# Patient Record
Sex: Female | Born: 1990
Health system: Southern US, Community
[De-identification: ages and names within clinical notes are randomized; demographics above are authoritative.]

## PROBLEM LIST (undated history)

## (undated) ENCOUNTER — Emergency Department: Admission: EM | Payer: Self-pay | Source: Home / Self Care

## (undated) DIAGNOSIS — B9689 Other specified bacterial agents as the cause of diseases classified elsewhere: Secondary | ICD-10-CM

## (undated) DIAGNOSIS — F32A Depression, unspecified: Secondary | ICD-10-CM

## (undated) DIAGNOSIS — A749 Chlamydial infection, unspecified: Secondary | ICD-10-CM

## (undated) DIAGNOSIS — S7290XA Unspecified fracture of unspecified femur, initial encounter for closed fracture: Secondary | ICD-10-CM

## (undated) DIAGNOSIS — N760 Acute vaginitis: Secondary | ICD-10-CM

## (undated) DIAGNOSIS — E559 Vitamin D deficiency, unspecified: Secondary | ICD-10-CM

## (undated) DIAGNOSIS — R7689 Other specified abnormal immunological findings in serum: Secondary | ICD-10-CM

## (undated) DIAGNOSIS — F419 Anxiety disorder, unspecified: Secondary | ICD-10-CM

## (undated) DIAGNOSIS — R768 Other specified abnormal immunological findings in serum: Secondary | ICD-10-CM

## (undated) HISTORY — DX: Depression, unspecified: F32.A

## (undated) HISTORY — DX: Vitamin D deficiency, unspecified: E55.9

## (undated) HISTORY — DX: Anxiety disorder, unspecified: F41.9

## (undated) HISTORY — DX: Unspecified fracture of unspecified femur, initial encounter for closed fracture: S72.90XA

## (undated) HISTORY — DX: Chlamydial infection, unspecified: A74.9

## (undated) HISTORY — PX: KNEE SURGERY: SHX244

## (undated) HISTORY — DX: Acute vaginitis: N76.0

## (undated) HISTORY — DX: Other specified bacterial agents as the cause of diseases classified elsewhere: B96.89

## (undated) HISTORY — DX: Other specified abnormal immunological findings in serum: R76.89

## (undated) HISTORY — DX: Other specified abnormal immunological findings in serum: R76.8

---

## 2010-12-11 ENCOUNTER — Emergency Department: Payer: Self-pay | Admitting: Emergency Medicine

## 2011-11-29 ENCOUNTER — Emergency Department: Payer: Self-pay | Admitting: Emergency Medicine

## 2011-11-29 LAB — COMPREHENSIVE METABOLIC PANEL
Alkaline Phosphatase: 109 U/L (ref 50–136)
Anion Gap: 8 (ref 7–16)
Bilirubin,Total: 0.3 mg/dL (ref 0.2–1.0)
Calcium, Total: 9.1 mg/dL (ref 8.5–10.1)
Co2: 29 mmol/L (ref 21–32)
EGFR (Non-African Amer.): >60
Glucose: 94 mg/dL (ref 65–99)
Osmolality: 278 (ref 275–301)
Sodium: 140 mmol/L (ref 136–145)
Total Protein: 8.2 g/dL (ref 6.4–8.2)

## 2011-11-29 LAB — CBC
HCT: 38.7 % (ref 35.0–47.0)
MCV: 83 fL (ref 80–100)
WBC: 13.3 10*3/uL — ABNORMAL HIGH (ref 3.6–11.0)

## 2011-11-29 LAB — LIPASE, BLOOD: Lipase: 187 U/L (ref 73–393)

## 2012-01-28 HISTORY — PX: KNEE SURGERY: SHX244

## 2012-09-18 ENCOUNTER — Emergency Department: Payer: Self-pay | Admitting: Emergency Medicine

## 2012-09-18 LAB — BASIC METABOLIC PANEL
Anion Gap: 5 — ABNORMAL LOW (ref 7–16)
Calcium, Total: 8.6 mg/dL (ref 8.5–10.1)
Co2: 27 mmol/L (ref 21–32)
EGFR (Non-African Amer.): >60
Glucose: 114 mg/dL — ABNORMAL HIGH (ref 65–99)
Potassium: 3.6 mmol/L (ref 3.5–5.1)

## 2012-09-18 LAB — CBC
HCT: 35.4 % (ref 35.0–47.0)
MCH: 27.8 pg (ref 26.0–34.0)
MCV: 82 fL (ref 80–100)
Platelet: 300 10*3/uL (ref 150–440)
RBC: 4.32 10*6/uL (ref 3.80–5.20)
RDW: 13.9 % (ref 11.5–14.5)
WBC: 14.7 10*3/uL — ABNORMAL HIGH (ref 3.6–11.0)

## 2014-05-29 ENCOUNTER — Other Ambulatory Visit: Payer: Self-pay | Admitting: Otolaryngology

## 2014-05-29 DIAGNOSIS — E049 Nontoxic goiter, unspecified: Secondary | ICD-10-CM

## 2014-05-31 ENCOUNTER — Ambulatory Visit: Payer: Self-pay

## 2016-06-18 ENCOUNTER — Ambulatory Visit (INDEPENDENT_AMBULATORY_CARE_PROVIDER_SITE_OTHER): Payer: Commercial Managed Care - PPO | Admitting: Obstetrics and Gynecology

## 2016-06-18 ENCOUNTER — Encounter: Payer: Self-pay | Admitting: Obstetrics and Gynecology

## 2016-06-18 VITALS — BP 130/80 | HR 75 | Ht 73.0 in | Wt 297.0 lb

## 2016-06-18 DIAGNOSIS — N914 Secondary oligomenorrhea: Secondary | ICD-10-CM | POA: Diagnosis not present

## 2016-06-18 DIAGNOSIS — R635 Abnormal weight gain: Secondary | ICD-10-CM

## 2016-06-18 NOTE — Progress Notes (Signed)
   Chief Complaint  Patient presents with  . Late Period    HPI:      Ms. Luciana AxeJessica A Eisenberger is a 26 y.o. G0P0000 who LMP was Patient's last menstrual period was 04/05/2016., presents today for irregular menses. She was seen 2/18 for her annual and at that time had had a light period 1/18 with neg UPTs. Menses used to be monthly, lasting 5 days, medium flow. She stopped OCPs 6/17 due to indigestion and wanting to conceive. She has gained 22# since 4/17. She tried herbalife to lose wt but didn't stick with it. She had a regular period 3/18 that lasted 4 days. No menses/spotting since. Pt had neg UPT at home a few days ago.  She denies pelvic pain, urin sx, vag sx.  There are no active problems to display for this patient.   Family History  Problem Relation Age of Onset  . Hypertension Mother   . Diabetes Maternal Grandmother   . Breast cancer Paternal Grandmother   . Diabetes Maternal Aunt   . Diabetes Maternal Uncle     Social History   Social History  . Marital status: Single    Spouse name: N/A  . Number of children: N/A  . Years of education: N/A   Occupational History  . Not on file.   Social History Main Topics  . Smoking status: Current Every Day Smoker  . Smokeless tobacco: Never Used  . Alcohol use No  . Drug use: No  . Sexual activity: Yes    Birth control/ protection: None   Other Topics Concern  . Not on file   Social History Narrative  . No narrative on file    No current outpatient prescriptions on file.  Review of Systems  Constitutional: Negative for fever.  Gastrointestinal: Negative for blood in stool, constipation, diarrhea, nausea and vomiting.  Genitourinary: Positive for menstrual problem. Negative for dyspareunia, dysuria, flank pain, frequency, hematuria, urgency, vaginal bleeding, vaginal discharge and vaginal pain.  Musculoskeletal: Negative for back pain.  Skin: Negative for rash.     OBJECTIVE:   Vitals:  BP 130/80   Pulse 75   Ht  6\' 1"  (1.854 m)   Wt 297 lb (134.7 kg)   LMP 04/05/2016   BMI 39.18 kg/m   Physical Exam  DEFERRED SINCE DONE 2/18   Assessment/Plan: Secondary oligomenorrhea - Check labs. Most likely related to wt gain. Will f/u with results. If no menses by 6/18, will do provera Rx.  - Plan: Hemoglobin A1c, TSH + free T4, Testosterone,Free and Total, Prolactin, FSH/LH, Estradiol, DHEA-sulfate, Androstenedione, 17-Hydroxyprogesterone  Morbid obesity (HCC) - Plan: TSH + free T4  Weight gain - Diet/exercise/wt loss/MyFitness pal, wt watchers discussed.  - Plan: TSH + free T4     Return if symptoms worsen or fail to improve.  Lesslie Mckeehan B. Kaya Pottenger, PA-C 06/18/2016 11:05 AM

## 2016-06-22 LAB — TSH+FREE T4
FREE T4: 0.99 ng/dL (ref 0.82–1.77)
TSH: 0.953 u[IU]/mL (ref 0.450–4.500)

## 2016-06-22 LAB — PROLACTIN: Prolactin: 12.4 ng/mL (ref 4.8–23.3)

## 2016-06-22 LAB — FSH/LH
FSH: 1 m[IU]/mL
LH: 1.6 m[IU]/mL

## 2016-06-22 LAB — ESTRADIOL: Estradiol: 76.7 pg/mL

## 2016-06-22 LAB — 17-HYDROXYPROGESTERONE: 17 HYDROXYPROGESTERONE: 117 ng/dL

## 2016-06-22 LAB — DHEA-SULFATE: DHEA-SO4: 378.6 ug/dL — ABNORMAL HIGH (ref 84.8–378.0)

## 2016-06-22 LAB — ANDROSTENEDIONE: ANDROSTENEDIONE: 48 ng/dL (ref 41–262)

## 2016-06-22 LAB — HEMOGLOBIN A1C
ESTIMATED AVERAGE GLUCOSE: 105 mg/dL
Hgb A1c MFr Bld: 5.3 % (ref 4.8–5.6)

## 2016-06-22 LAB — TESTOSTERONE,FREE AND TOTAL
Testosterone, Free: 3.9 pg/mL (ref 0.0–4.2)
Testosterone: 30 ng/dL (ref 8–48)

## 2016-06-26 ENCOUNTER — Telehealth: Payer: Self-pay | Admitting: Obstetrics and Gynecology

## 2016-06-26 NOTE — Telephone Encounter (Signed)
Pt aware of neg labs. Oligomenorrhea most likely related to wt gain. Recommended wt loss again to see if menses resume to normal for conception. If no change, can f/u with MD for fertility. Pt states menses started last wk. F/u prn.

## 2016-08-12 ENCOUNTER — Encounter: Payer: Self-pay | Admitting: *Deleted

## 2016-08-12 ENCOUNTER — Ambulatory Visit
Admission: EM | Admit: 2016-08-12 | Discharge: 2016-08-12 | Disposition: A | Discharge status: Home / Self Care | Payer: Commercial Managed Care - PPO | Attending: Family Medicine | Admitting: Family Medicine

## 2016-08-12 DIAGNOSIS — S29019A Strain of muscle and tendon of unspecified wall of thorax, initial encounter: Secondary | ICD-10-CM | POA: Diagnosis not present

## 2016-08-12 DIAGNOSIS — M549 Dorsalgia, unspecified: Secondary | ICD-10-CM

## 2016-08-12 MED ORDER — MELOXICAM 15 MG PO TABS: 15.0000 mg | ORAL_TABLET | Freq: Every day | ORAL | 0 refills | Status: DC | PRN

## 2016-08-12 MED ORDER — CYCLOBENZAPRINE HCL 10 MG PO TABS: 10.0000 mg | ORAL_TABLET | Freq: Two times a day (BID) | ORAL | 0 refills | Status: DC | PRN

## 2016-08-12 NOTE — Discharge Instructions (Signed)
Take medication as prescribed. Rest. Drink plenty of fluids. Stretch.  ° °Follow up with your primary care physician this week as needed. Return to Urgent care for new or worsening concerns.  ° °

## 2016-08-12 NOTE — ED Provider Notes (Signed)
[facility] ____________________________________________  Time seen: Approximately 9:51 AM  I have reviewed the triage vital signs and the nursing notes.   HISTORY  Chief Complaint Back Pain   HPI Jillian AxeJessica A Garcia is a 26 y.o. female presenting for evaluation of back pain. Patient reports she has had back pain since this past Thursday. Patient reports this past Thursday she was at work at her [employer] job. States that she was standing upright and went to move a box of lids that weighs approximately 20 pounds to the side. Reports as she twisted holding a box she felt onset of back pain. Patient reports she continued to work. Reports Friday the pain was better with Aleve. 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 Patient then reports Sunday and Monday pain had increased in the same area. Denies any fall, direct injury or trauma. Denies atypical heavy lifting. Denies pain radiation, paresthesias, skin changes, insect bites, fevers, chest pain, shortness of breath. Reports continues to eat and drink well. Reports she has had history of some similar back pain in the past that went away. Reports is continue to remain active. States pain improves with rest, worsens with activity. Has tried over-the-counter Aleve and ointments, with some improvement, no resolution. Denies other aggravating or alleviating factors. Reports otherwise feels well.   Denies chest pain, shortness of breath, abdominal pain, dysuria, extremity pain, extremity swelling or rash. Denies recent sickness. Denies recent antibiotic use. Denies cardiac history. Denies renal insufficiency.  Patient's last menstrual period was 07/23/2016 (approximate).: Denies pregnancy   History reviewed. No pertinent past medical history. Denies  There are no active problems to display for this patient.   Past Surgical History:  Procedure Laterality Date    . KNEE SURGERY       No current facility-administered medications for this encounter.   Current Outpatient Prescriptions:  .  cyclobenzaprine (FLEXERIL) 10 MG tablet, Take 1 tablet (10 mg total) by mouth 2 (two) times daily as needed for muscle spasms. Do not drive while taking as can cause drowsiness, Disp: 15 tablet, Rfl: 0 .  meloxicam (MOBIC) 15 MG tablet, Take 1 tablet (15 mg total) by mouth daily as needed., Disp: 10 tablet, Rfl: 0  Allergies Patient has no known allergies.  Family History  Problem Relation Age of Onset  . Hypertension Mother   . Diabetes Maternal Grandmother   . Breast cancer Paternal Grandmother   . Diabetes Maternal Aunt   . Diabetes Maternal Uncle     Social History Social History  Substance Use Topics  . Smoking status: Current Every Day Smoker  . Smokeless tobacco: Never Used  . Alcohol use No    Review of Systems Constitutional: No fever/chills Cardiovascular: Denies chest pain. Respiratory: Denies shortness of breath. Gastrointestinal: No abdominal pain.  No nausea, no vomiting.  No diarrhea.   Genitourinary: Negative for dysuria. Musculoskeletal: Positive for back pain. Skin: Negative for rash.   ____________________________________________   PHYSICAL EXAM:  VITAL SIGNS: ED Triage Vitals  Enc Vitals Group     BP 08/12/16 0902 134/88     Pulse Rate 08/12/16 0902 74     Resp 08/12/16 0902 16     Temp 08/12/16 0902 98 F (36.7 C)     Temp Source 08/12/16 0902 Oral     SpO2 08/12/16 0902 100 %     Weight 08/12/16 0904 285 lb (129.3 kg)  Height 08/12/16 0904 6\' 1"  (1.854 m)     Head Circumference --      Peak Flow --      Pain Score 08/12/16 0905 10     Pain Loc --      Pain Edu? --      Excl. in GC? --     Constitutional: Alert and oriented. Well appearing and in no acute distress. Cardiovascular: Normal rate, regular rhythm. Grossly normal heart sounds.  Good peripheral circulation. Respiratory: Normal respiratory  effort without tachypnea nor retractions. Breath sounds are clear and equal bilaterally. No wheezes, rales, rhonchi. Gastrointestinal: Soft and nontender. No CVA tenderness. Musculoskeletal:  No midline cervical, thoracic or lumbar tenderness to palpation. Mild mid thoracic parathoracic tenderness to palpitation along bilateral latissimus dorsi, no midline tenderness, pain increases mildly with lumbar flexion and extension and right and left lumbar rotation, pain increases moderately with bilateral overhear stretching, full range of motion present. 5/5 strength to bilateral upper and lower extremities. No pain with standing bilateral knee lifts, steady gait. Changes positions quickly in room. Bilateral plantar flexion and dorsiflexion strong and equal. No saddle anesthesia.  Neurologic:  Normal speech and language. No gross focal neurologic deficits are appreciated. Speech is normal. No gait instability.  Skin:  Skin is warm, dry and intact. No rash noted. Psychiatric: Mood and affect are normal. Speech and behavior are normal. Patient exhibits appropriate insight and judgment   ___________________________________________   LABS (all labs ordered are listed, but only abnormal results are displayed)  Labs Reviewed - No data to display ____________________________________________  PROCEDURES Procedures    INITIAL IMPRESSION / ASSESSMENT AND PLAN / ED COURSE  Pertinent labs & imaging results that were available during my care of the patient were reviewed by me and considered in my medical decision making (see chart for details).  Well-appearing patient. No acute distress. Presents for evaluation of thoracic back pain. No midline tenderness. No direct trauma. Suspect strain injury. Encouraged rest, ice versus heat, stretching and avoidance of strenuous activity. Also discussed with patient strict follow-up and return parameters and follow-up for continued pain with concern of disc injury. Will  treat patient with oral daily Modicon when necessary Flexeril. Work note given for today and tomorrow.Discussed indication, risks and benefits of medications with patient.  Discussed follow up with Primary care physician this week. Discussed follow up and return parameters including no resolution or any worsening concerns. Patient verbalized understanding and agreed to plan.   ____________________________________________   FINAL CLINICAL IMPRESSION(S) / ED DIAGNOSES  Final diagnoses:  Thoracic myofascial strain, initial encounter     Discharge Medication List as of 08/12/2016 10:09 AM    START taking these medications   Details  cyclobenzaprine (FLEXERIL) 10 MG tablet Take 1 tablet (10 mg total) by mouth 2 (two) times daily as needed for muscle spasms. Do not drive while taking as can cause drowsiness, Starting Tue 08/12/2016, Normal    meloxicam (MOBIC) 15 MG tablet Take 1 tablet (15 mg total) by mouth daily as needed., Starting Tue 08/12/2016, Normal        Note: This dictation was prepared with Dragon dictation along with smaller phrase technology. Any transcriptional errors that result from this process are unintentional.         Renford Dills, NP 08/12/16 1141

## 2016-08-12 NOTE — ED Triage Notes (Signed)
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 Pain has persisted and worse this am.

## 2016-10-27 DIAGNOSIS — A749 Chlamydial infection, unspecified: Secondary | ICD-10-CM

## 2016-10-27 HISTORY — DX: Chlamydial infection, unspecified: A74.9

## 2016-11-18 ENCOUNTER — Ambulatory Visit: Payer: Commercial Managed Care - PPO | Admitting: Obstetrics and Gynecology

## 2016-11-19 ENCOUNTER — Ambulatory Visit (INDEPENDENT_AMBULATORY_CARE_PROVIDER_SITE_OTHER): Payer: Commercial Managed Care - PPO | Admitting: Obstetrics and Gynecology

## 2016-11-19 ENCOUNTER — Encounter: Payer: Self-pay | Admitting: Obstetrics and Gynecology

## 2016-11-19 VITALS — BP 132/82 | HR 80 | Ht 73.0 in | Wt 276.0 lb

## 2016-11-19 DIAGNOSIS — Z113 Encounter for screening for infections with a predominantly sexual mode of transmission: Secondary | ICD-10-CM

## 2016-11-19 DIAGNOSIS — N760 Acute vaginitis: Secondary | ICD-10-CM | POA: Diagnosis not present

## 2016-11-19 DIAGNOSIS — B9689 Other specified bacterial agents as the cause of diseases classified elsewhere: Secondary | ICD-10-CM | POA: Diagnosis not present

## 2016-11-19 LAB — POCT WET PREP WITH KOH
Clue Cells Wet Prep HPF POC: POSITIVE
KOH Prep POC: POSITIVE — AB
Trichomonas, UA: NEGATIVE
Yeast Wet Prep HPF POC: NEGATIVE

## 2016-11-19 MED ORDER — CLINDAMYCIN PHOSPHATE 2 % VA CREA: 1.0000 | TOPICAL_CREAM | Freq: Every day | VAGINAL | 0 refills | Status: DC

## 2016-11-19 NOTE — Progress Notes (Signed)
Chief Complaint  Patient presents with  . Vaginal Discharge    BV/YI? Itching/Odor x 1 wk. Used Metrogel rx'd by Phone visit Insurance    HPI:      [name] is a 26 y.o. G0P0000 who LMP was Patient's last menstrual period was 10/28/2016., presents today for BV sx of increased d/c, odor, irritation for the past wk. No LBP, belly pain, fevers. No urin sx. She was treated 9/18 with flagyl by MD online but she didn't use "it correctly" and sx recurred.  She has a hx of recurrent BV in the past. Culture showed bacteria not responsive to flagyl in 2017.   She is sex active, with new partner. She is not using condoms.   Past Medical History:  Diagnosis Date  . BV (bacterial vaginosis)     Past Surgical History:  Procedure Laterality Date  . KNEE SURGERY      Family History  Problem Relation Age of Onset  . Hypertension Mother   . Diabetes Maternal Grandmother   . Breast cancer Paternal Grandmother   . Diabetes Maternal Aunt   . Diabetes Maternal Uncle     Social History   Social History  . Marital status: Single    Spouse name: N/A  . Number of children: N/A  . Years of education: N/A   Occupational History  . Not on file.   Social History Main Topics  . Smoking status: Current Every Day Smoker  . Smokeless tobacco: Never Used  . Alcohol use No  . Drug use: No  . Sexual activity: Yes    Birth control/ protection: None   Other Topics Concern  . Not on file   Social History Narrative  . No narrative on file     Current Outpatient Prescriptions:  .  clindamycin (CLEOCIN) 2 % vaginal cream, Place 1 Applicatorful vaginally at bedtime., Disp: 40 g, Rfl: 0 .  cyclobenzaprine (FLEXERIL) 10 MG tablet, Take 1 tablet (10 mg total) by mouth 2 (two) times daily as needed for muscle spasms. Do not drive while taking as can cause drowsiness (Patient not taking: Reported on 11/19/2016), Disp: 15 tablet, Rfl: 0 .  meloxicam (MOBIC) 15 MG tablet, Take 1 tablet (15 mg  total) by mouth daily as needed. (Patient not taking: Reported on 11/19/2016), Disp: 10 tablet, Rfl: 0   ROS:  Review of Systems  Constitutional: Negative for fever.  Gastrointestinal: Negative for blood in stool, constipation, diarrhea, nausea and vomiting.  Genitourinary: Positive for vaginal discharge. Negative for dyspareunia, dysuria, flank pain, frequency, hematuria, urgency, vaginal bleeding and vaginal pain.  Musculoskeletal: Negative for back pain.  Skin: Negative for rash.     OBJECTIVE:   Vitals:  BP 132/82 (BP Location: Left Arm, Patient Position: Sitting, Cuff Size: Large)   Pulse 80   Ht 6\' 1"  (1.854 m)   Wt 276 lb (125.2 kg)   LMP 10/28/2016   BMI 36.41 kg/m   Physical Exam  Constitutional: She is oriented to person, place, and time and well-developed, well-nourished, and in no distress. Vital signs are normal.  Genitourinary: Uterus normal, cervix normal, right adnexa normal and left adnexa normal. Uterus is not enlarged. Cervix exhibits no motion tenderness and no tenderness. Right adnexum displays no mass and no tenderness. Left adnexum displays no mass and no tenderness. Vulva exhibits exudate. Vulva exhibits no erythema, no lesion, no rash and no tenderness. Vagina exhibits no lesion. Thin  fishy  white and vaginal discharge found.  Neurological: She is oriented to person, place, and time.  Vitals reviewed.   Results: Results for orders placed or performed in visit on 11/19/16 (from the past 24 hour(s))  POCT Wet Prep with KOH     Status: Abnormal   Collection Time: 11/19/16  5:10 PM  Result Value Ref Range   Trichomonas, UA Negative    Clue Cells Wet Prep HPF POC pos    Epithelial Wet Prep HPF POC  Few, Moderate, Many, Too numerous to count   Yeast Wet Prep HPF POC neg    Bacteria Wet Prep HPF POC  Few   RBC Wet Prep HPF POC     WBC Wet Prep HPF POC     KOH Prep POC Positive (A) Negative     Assessment/Plan: Bacterial vaginosis - Pos wet prep. Hx  of recurrent in past. Rx cleocin. Add probiotics/condoms. F/u prn.  - Plan: clindamycin (CLEOCIN) 2 % vaginal cream, POCT Wet Prep with KOH  Screening for STD (sexually transmitted disease) - Plan: Chlamydia/Gonococcus/Trichomonas, NAA    Meds ordered this encounter  Medications  . clindamycin (CLEOCIN) 2 % vaginal cream    Sig: Place 1 Applicatorful vaginally at bedtime.    Dispense:  40 g    Refill:  0      Return if symptoms worsen or fail to improve.  Alicia B. Copland, PA-C 11/19/2016 5:11 PM

## 2016-11-24 ENCOUNTER — Telehealth: Payer: Self-pay | Admitting: Obstetrics and Gynecology

## 2016-11-24 ENCOUNTER — Encounter: Payer: Self-pay | Admitting: Obstetrics and Gynecology

## 2016-11-24 DIAGNOSIS — A749 Chlamydial infection, unspecified: Secondary | ICD-10-CM | POA: Insufficient documentation

## 2016-11-24 LAB — CHLAMYDIA/GONOCOCCUS/TRICHOMONAS, NAA
Chlamydia by NAA: POSITIVE — AB
Gonococcus by NAA: NEGATIVE
Trich vag by NAA: NEGATIVE

## 2016-11-24 MED ORDER — DOXYCYCLINE HYCLATE 100 MG PO CAPS: 100.0000 mg | ORAL_CAPSULE | Freq: Two times a day (BID) | ORAL | 0 refills | Status: DC

## 2016-11-24 NOTE — Telephone Encounter (Signed)
LM with abn culture results. Pt needs to call back for further details (didn't want to leave on VM). Rx doxy eRxd. Partner needs tx. RN to notify [facility]. RTO in 3 wks for TOC.

## 2016-12-15 ENCOUNTER — Ambulatory Visit: Payer: Commercial Managed Care - PPO | Admitting: Obstetrics and Gynecology

## 2016-12-31 ENCOUNTER — Encounter: Payer: Self-pay | Admitting: Obstetrics and Gynecology

## 2016-12-31 ENCOUNTER — Ambulatory Visit (INDEPENDENT_AMBULATORY_CARE_PROVIDER_SITE_OTHER): Payer: Commercial Managed Care - PPO | Admitting: Obstetrics and Gynecology

## 2016-12-31 VITALS — BP 128/82 | HR 80 | Ht 73.0 in | Wt 288.0 lb

## 2016-12-31 DIAGNOSIS — A749 Chlamydial infection, unspecified: Secondary | ICD-10-CM

## 2016-12-31 DIAGNOSIS — N760 Acute vaginitis: Secondary | ICD-10-CM | POA: Diagnosis not present

## 2016-12-31 DIAGNOSIS — Z113 Encounter for screening for infections with a predominantly sexual mode of transmission: Secondary | ICD-10-CM

## 2016-12-31 DIAGNOSIS — B9689 Other specified bacterial agents as the cause of diseases classified elsewhere: Secondary | ICD-10-CM | POA: Diagnosis not present

## 2016-12-31 LAB — POCT WET PREP WITH KOH
CLUE CELLS WET PREP PER HPF POC: POSITIVE
KOH Prep POC: POSITIVE — AB
Trichomonas, UA: NEGATIVE
Yeast Wet Prep HPF POC: NEGATIVE

## 2016-12-31 MED ORDER — CLINDAMYCIN HCL 300 MG PO CAPS: 300.0000 mg | ORAL_CAPSULE | Freq: Two times a day (BID) | ORAL | 0 refills | Status: AC

## 2016-12-31 NOTE — Progress Notes (Signed)
Chief Complaint  Patient presents with  . Follow-up    TOC     HPI:      [name] is a 26 y.o. G0P0000 who LMP was No LMP recorded., presents today for STD test of cure. She was diagnosed with chlamydia 10/18.She was treated with doxy BID for 7 days. Her partner has been treated. She denies any further symptoms.   She has noticed vaginal itching and fishy odor without increased d/c for the past 2 wks. She treated with diflucan for vaginal itching 12/03/16 after doxy use. Sx resolved temporarily but have since recurred. Pt with hx of recurrent BV. Culture showed bacteria not responsive to flagyl 2017. Pt last treated for BV 10/18 with cleocin vag crm. Pt currently on her period.  Past Medical History:  Diagnosis Date  . BV (bacterial vaginosis)   . Chlamydia 10/2016    Social History   Socioeconomic History  . Marital status: Single    Spouse name: Not on file  . Number of children: Not on file  . Years of education: Not on file  . Highest education level: Not on file  Social Needs  . Financial resource strain: Not on file  . Food insecurity - worry: Not on file  . Food insecurity - inability: Not on file  . Transportation needs - medical: Not on file  . Transportation needs - non-medical: Not on file  Occupational History  . Not on file  Tobacco Use  . Smoking status: Current Every Day Smoker  . Smokeless tobacco: Never Used  Substance and Sexual Activity  . Alcohol use: No  . Drug use: No  . Sexual activity: Yes    Birth control/protection: None  Other Topics Concern  . Not on file  Social History Narrative  . Not on file     Current Outpatient Medications:  .  clindamycin (CLEOCIN) 2 % vaginal cream, Place 1 Applicatorful vaginally at bedtime. (Patient not taking: Reported on 12/31/2016), Disp: 40 g, Rfl: 0 .  clindamycin (CLEOCIN) 300 MG capsule, Take 1 capsule (300 mg total) by mouth 2 (two) times daily for 7 days., Disp: 14 capsule, Rfl: 0 .   cyclobenzaprine (FLEXERIL) 10 MG tablet, Take 1 tablet (10 mg total) by mouth 2 (two) times daily as needed for muscle spasms. Do not drive while taking as can cause drowsiness (Patient not taking: Reported on 11/19/2016), Disp: 15 tablet, Rfl: 0 .  doxycycline (VIBRAMYCIN) 100 MG capsule, Take 1 capsule (100 mg total) by mouth 2 (two) times daily. (Patient not taking: Reported on 12/31/2016), Disp: 14 capsule, Rfl: 0 .  meloxicam (MOBIC) 15 MG tablet, Take 1 tablet (15 mg total) by mouth daily as needed. (Patient not taking: Reported on 11/19/2016), Disp: 10 tablet, Rfl: 0   ROS:  Review of Systems  Constitutional: Negative for fever.  Gastrointestinal: Negative for blood in stool, constipation, diarrhea, nausea and vomiting.  Genitourinary: Negative for dyspareunia, dysuria, flank pain, frequency, hematuria, urgency, vaginal bleeding, vaginal discharge and vaginal pain.  Musculoskeletal: Negative for back pain.  Skin: Negative for rash.     Objective: BP 128/82 (BP Location: Left Arm, Patient Position: Sitting, Cuff Size: Normal)   Pulse 80   Ht 6\' 1"  (1.854 m)   Wt 288 lb (130.6 kg)   BMI 38.00 kg/m    Physical Exam  Genitourinary: There is no rash, tenderness, lesion or Bartholin's cyst on the right labia. There is no rash, tenderness, lesion or Bartholin's cyst  on the left labia. There is bleeding in the vagina. No erythema or tenderness in the vagina. No vaginal discharge found. Right adnexum does not display mass and does not display tenderness. Left adnexum does not display mass and does not display tenderness. Cervix does not exhibit motion tenderness, discharge or friability. Uterus is not enlarged or tender.  Nursing note and vitals reviewed.  Results for orders placed or performed in visit on 12/31/16 (from the past 24 hour(s))  POCT Wet Prep with KOH     Status: Abnormal   Collection Time: 12/31/16  5:02 PM  Result Value Ref Range   Trichomonas, UA Negative    Clue Cells  Wet Prep HPF POC pos    Epithelial Wet Prep HPF POC  Few, Moderate, Many, Too numerous to count   Yeast Wet Prep HPF POC neg    Bacteria Wet Prep HPF POC  Few   RBC Wet Prep HPF POC     WBC Wet Prep HPF POC     KOH Prep POC Positive (A) Negative     Assessment/Plan: Chlamydia - TOC today. Nuswab. Will call pt with resutls. - Plan: Chlamydia/Gonococcus/Trichomonas, NAA  Screening for STD (sexually transmitted disease) - Plan: Chlamydia/Gonococcus/Trichomonas, NAA  Bacterial vaginosis - Rx clindamycin since on period. CONDOMS/probiotics. May need maintenance tx if sx recur. - Plan: POCT Wet Prep with KOH, clindamycin (CLEOCIN) 300 MG capsule   F/U  Return if symptoms worsen or fail to improve.  Mayford Alberg B. Azaliyah Kennard, PA-C 12/31/2016 5:02 PM

## 2016-12-31 NOTE — Patient Instructions (Signed)
I value your feedback and entrusting us with your care. If you get a [facility] patient survey, I would appreciate you taking the time to let us know about your experience today. Thank you! 

## 2017-01-06 LAB — CHLAMYDIA/GONOCOCCUS/TRICHOMONAS, NAA
Chlamydia by NAA: NEGATIVE
Gonococcus by NAA: NEGATIVE
Trich vag by NAA: NEGATIVE

## 2017-01-22 ENCOUNTER — Other Ambulatory Visit: Payer: Self-pay | Admitting: Obstetrics and Gynecology

## 2017-01-22 DIAGNOSIS — B9689 Other specified bacterial agents as the cause of diseases classified elsewhere: Secondary | ICD-10-CM

## 2017-01-22 DIAGNOSIS — N760 Acute vaginitis: Principal | ICD-10-CM

## 2017-01-22 MED ORDER — CLINDAMYCIN PHOSPHATE 2 % VA CREA: 1.0000 | TOPICAL_CREAM | Freq: Every day | VAGINAL | 0 refills | Status: DC

## 2017-01-22 NOTE — Telephone Encounter (Signed)
Please advise for refill?  

## 2017-01-22 NOTE — Progress Notes (Signed)
Recurrent BV

## 2017-02-16 ENCOUNTER — Ambulatory Visit (INDEPENDENT_AMBULATORY_CARE_PROVIDER_SITE_OTHER): Payer: Commercial Managed Care - PPO | Admitting: Obstetrics and Gynecology

## 2017-02-16 ENCOUNTER — Encounter: Payer: Self-pay | Admitting: Obstetrics and Gynecology

## 2017-02-16 VITALS — BP 128/80 | HR 67 | Ht 73.0 in | Wt 286.0 lb

## 2017-02-16 DIAGNOSIS — N3001 Acute cystitis with hematuria: Secondary | ICD-10-CM | POA: Diagnosis not present

## 2017-02-16 LAB — POCT URINALYSIS DIPSTICK
BILIRUBIN UA: NEGATIVE
GLUCOSE UA: NEGATIVE
KETONES UA: NEGATIVE
Nitrite, UA: NEGATIVE
Protein, UA: NEGATIVE
SPEC GRAV UA: 1.02 (ref 1.010–1.025)
pH, UA: 6 (ref 5.0–8.0)

## 2017-02-16 MED ORDER — CIPROFLOXACIN HCL 500 MG PO TABS: 500.0000 mg | ORAL_TABLET | Freq: Two times a day (BID) | ORAL | 0 refills | Status: AC

## 2017-02-16 NOTE — Patient Instructions (Signed)
I value your feedback and entrusting us with your care. If you get a [facility] patient survey, I would appreciate you taking the time to let us know about your experience today. Thank you! 

## 2017-02-16 NOTE — Progress Notes (Signed)
Chief Complaint  Patient presents with  . Urinary Tract Infection    HPI:      [name] is a 27 y.o. G0P0000 who LMP was Patient's last menstrual period was 01/30/2017., presents today for UTI. She has had dysuria, frequency, nocturia, and urine odor for the past month. She was prescribed macrobid by [facility] a few wks ago and sx improved while pt was on abx, but sx recurred the day after she completed tx. No vag sx. No LBP, belly pain, fevers. No current vaginal bleeding.    Past Medical History:  Diagnosis Date  . BV (bacterial vaginosis)   . Chlamydia 10/2016    Past Surgical History:  Procedure Laterality Date  . KNEE SURGERY      Family History  Problem Relation Age of Onset  . Hypertension Mother   . Diabetes Maternal Grandmother   . Breast cancer Paternal Grandmother   . Diabetes Maternal Aunt   . Diabetes Maternal Uncle     Social History   Socioeconomic History  . Marital status: Single    Spouse name: Not on file  . Number of children: Not on file  . Years of education: Not on file  . Highest education level: Not on file  Social Needs  . Financial resource strain: Not on file  . Food insecurity - worry: Not on file  . Food insecurity - inability: Not on file  . Transportation needs - medical: Not on file  . Transportation needs - non-medical: Not on file  Occupational History  . Not on file  Tobacco Use  . Smoking status: Former Games developermoker  . Smokeless tobacco: Never Used  Substance and Sexual Activity  . Alcohol use: No  . Drug use: No  . Sexual activity: Yes    Birth control/protection: None  Other Topics Concern  . Not on file  Social History Narrative  . Not on file     Current Outpatient Medications:  .  ciprofloxacin (CIPRO) 500 MG tablet, Take 1 tablet (500 mg total) by mouth 2 (two) times daily for 5 days., Disp: 10 tablet, Rfl: 0 .  clindamycin (CLEOCIN) 2 % vaginal cream, Place 1 Applicatorful vaginally at bedtime. (Patient  not taking: Reported on 02/16/2017), Disp: 40 g, Rfl: 0 .  cyclobenzaprine (FLEXERIL) 10 MG tablet, Take 1 tablet (10 mg total) by mouth 2 (two) times daily as needed for muscle spasms. Do not drive while taking as can cause drowsiness (Patient not taking: Reported on 11/19/2016), Disp: 15 tablet, Rfl: 0 .  doxycycline (VIBRAMYCIN) 100 MG capsule, Take 1 capsule (100 mg total) by mouth 2 (two) times daily. (Patient not taking: Reported on 12/31/2016), Disp: 14 capsule, Rfl: 0 .  meloxicam (MOBIC) 15 MG tablet, Take 1 tablet (15 mg total) by mouth daily as needed. (Patient not taking: Reported on 11/19/2016), Disp: 10 tablet, Rfl: 0 .  nitrofurantoin, macrocrystal-monohydrate, (MACROBID) 100 MG capsule, Take 100 mg by mouth 2 (two) times daily., Disp: , Rfl: 0   ROS:  Review of Systems  Constitutional: Negative for fever.  Gastrointestinal: Negative for blood in stool, constipation, diarrhea, nausea and vomiting.  Genitourinary: Positive for dysuria, frequency and urgency. Negative for dyspareunia, flank pain, hematuria, vaginal bleeding, vaginal discharge and vaginal pain.  Musculoskeletal: Negative for back pain.  Skin: Negative for rash.     OBJECTIVE:   Vitals:  BP 128/80   Pulse 67   Ht 6\' 1"  (1.854 m)   Wt 286 lb (129.7  kg)   LMP 01/30/2017   BMI 37.73 kg/m   Physical Exam  Constitutional: She is oriented to person, place, and time and well-developed, well-nourished, and in no distress.  Abdominal: There is no CVA tenderness.  Neurological: She is alert and oriented to person, place, and time.  Psychiatric: Affect and judgment normal.  Vitals reviewed.   Results: Results for orders placed or performed in visit on 02/16/17 (from the past 24 hour(s))  POCT Urinalysis Dipstick     Status: Abnormal   Collection Time: 02/16/17 11:21 AM  Result Value Ref Range   Color, UA yellow    Clarity, UA cloudy    Glucose, UA neg    Bilirubin, UA neg    Ketones, UA neg    Spec Grav,  UA 1.020 1.010 - 1.025   Blood, UA trace    pH, UA 6.0 5.0 - 8.0   Protein, UA neg    Urobilinogen, UA  0.2 or 1.0 E.U./dL   Nitrite, UA neg    Leukocytes, UA Moderate (2+) (A) Negative   Appearance     Odor       Assessment/Plan: Acute cystitis with hematuria - Pos dip. Check C&S. Rx cipro. Will f/u with results and sx. - Plan: POCT Urinalysis Dipstick, Urine Culture, ciprofloxacin (CIPRO) 500 MG tablet    Meds ordered this encounter  Medications  . ciprofloxacin (CIPRO) 500 MG tablet    Sig: Take 1 tablet (500 mg total) by mouth 2 (two) times daily for 5 days.    Dispense:  10 tablet    Refill:  0      Return if symptoms worsen or fail to improve.  Alicia B. Copland, PA-C 02/16/2017 11:22 AM

## 2017-02-16 NOTE — Progress Notes (Signed)
286lb

## 2017-02-18 LAB — URINE CULTURE

## 2017-03-17 ENCOUNTER — Encounter: Payer: Self-pay | Admitting: Advanced Practice Midwife

## 2017-03-17 ENCOUNTER — Ambulatory Visit (INDEPENDENT_AMBULATORY_CARE_PROVIDER_SITE_OTHER): Payer: Commercial Managed Care - PPO | Admitting: Advanced Practice Midwife

## 2017-03-17 VITALS — BP 130/98 | HR 66 | Ht 73.0 in | Wt 289.0 lb

## 2017-03-17 DIAGNOSIS — Z113 Encounter for screening for infections with a predominantly sexual mode of transmission: Secondary | ICD-10-CM | POA: Diagnosis not present

## 2017-03-17 NOTE — Progress Notes (Signed)
S: The patient is here today with concern for STDs. Her boyfriend has recently had some genital swelling and was diagnosed with HSV. She has had the same partner since last August. She does not use protection against STDs. She does not use any method of birth control. "If it happens, it happens". She has also had some discharge, odor and itching in the past week. She has a history of BV, yeast and UTI in the recent past. She uses AZO for symptoms with good relief. She also wonders about a possible pregnancy since she had some spotting after her period. Reassurance given that it is unlikely she is pregnant since she is probably ovulating around now and the last time she had intercourse was 1 week ago.   O: Vital Signs: BP (!) 130/98   Pulse 66   Ht 6\' 1"  (1.854 m)   Wt 289 lb (131.1 kg)   LMP 03/02/2017 (Exact Date)   BMI 38.13 kg/m  Constitutional: Well nourished, well developed female in no acute distress.  HEENT: normal Skin: Warm and dry.  Cardiovascular: Regular rate and rhythm.   Respiratory: Clear to auscultation bilateral. Normal respiratory effort Psych: Alert and Oriented x3. No memory deficits. Normal mood and affect.  MS: normal gait, normal bilateral lower extremity ROM/strength/stability.  Pelvic exam:  is not limited by body habitus EGBUS: within normal limits Vagina: within normal limits and with normal mucosa, thick white discharge present Cervix: appearance normal  Wet Prep negative for clue cells, yeast and whiff  A: 27 yo female STD testing, vaginitis symptoms  P: NuSwab for vaginitis plus STDs STD panel Return to clinic PRN  Tresea MallJane Izsak Meir, CNM

## 2017-03-18 LAB — RPR QUALITATIVE: RPR Ser Ql: NONREACTIVE

## 2017-03-18 LAB — HIV ANTIBODY (ROUTINE TESTING W REFLEX): HIV Screen 4th Generation wRfx: NONREACTIVE

## 2017-03-18 LAB — HSV 1 AND 2 IGM ABS, INDIRECT: HSV 1 IgM: <1:10 {titer}

## 2017-03-18 LAB — HEPATITIS B SURFACE ANTIBODY,QUALITATIVE: Hep B Surface Ab, Qual: REACTIVE

## 2017-03-18 LAB — HEPATITIS C ANTIBODY

## 2017-03-18 LAB — HSV 2 ANTIBODY, IGG

## 2017-03-20 ENCOUNTER — Telehealth: Payer: Self-pay | Admitting: Advanced Practice Midwife

## 2017-03-20 NOTE — Telephone Encounter (Signed)
Pt is calling back about her lab results. Please advise if there is a prescriptions she  Need.

## 2017-03-20 NOTE — Telephone Encounter (Signed)
Spoke with patient and reviewed results with her.

## 2017-03-22 LAB — NUSWAB VAGINITIS PLUS (VG+)
CANDIDA ALBICANS, NAA: POSITIVE — AB
CANDIDA GLABRATA, NAA: NEGATIVE
Chlamydia trachomatis, NAA: NEGATIVE
NEISSERIA GONORRHOEAE, NAA: NEGATIVE
Trich vag by NAA: NEGATIVE

## 2017-04-03 ENCOUNTER — Other Ambulatory Visit: Payer: Self-pay | Admitting: Advanced Practice Midwife

## 2017-04-03 DIAGNOSIS — B379 Candidiasis, unspecified: Secondary | ICD-10-CM

## 2017-04-03 MED ORDER — FLUCONAZOLE 150 MG PO TABS: 150.0000 mg | ORAL_TABLET | Freq: Once | ORAL | 1 refills | Status: DC

## 2017-04-03 NOTE — Progress Notes (Signed)
Rx diflucan sent to patient pharmacy/ Patient aware.

## 2017-07-24 ENCOUNTER — Ambulatory Visit (INDEPENDENT_AMBULATORY_CARE_PROVIDER_SITE_OTHER): Payer: Commercial Managed Care - PPO | Admitting: Obstetrics and Gynecology

## 2017-07-24 ENCOUNTER — Encounter: Payer: Self-pay | Admitting: Obstetrics and Gynecology

## 2017-07-24 VITALS — BP 128/82 | HR 92 | Ht 73.0 in | Wt 293.0 lb

## 2017-07-24 DIAGNOSIS — R309 Painful micturition, unspecified: Secondary | ICD-10-CM | POA: Diagnosis not present

## 2017-07-24 DIAGNOSIS — Z113 Encounter for screening for infections with a predominantly sexual mode of transmission: Secondary | ICD-10-CM

## 2017-07-24 DIAGNOSIS — N926 Irregular menstruation, unspecified: Secondary | ICD-10-CM | POA: Diagnosis not present

## 2017-07-24 LAB — POCT URINALYSIS DIPSTICK
BILIRUBIN UA: NEGATIVE
Blood, UA: NEGATIVE
Glucose, UA: NEGATIVE
KETONES UA: NEGATIVE
Leukocytes, UA: NEGATIVE
Nitrite, UA: NEGATIVE
PROTEIN UA: NEGATIVE
Spec Grav, UA: 1.025 (ref 1.010–1.025)
Urobilinogen, UA: 0.2 E.U./dL
pH, UA: 5 (ref 5.0–8.0)

## 2017-07-24 LAB — POCT URINE PREGNANCY: Preg Test, Ur: NEGATIVE

## 2017-07-24 NOTE — Progress Notes (Signed)
   Patient ID: Jillian Garcia, female   DOB: 1990/03/03, 27 y.o.   MRN: [id number]  Reason for Consult: Urinary Tract Infection (Possible Yeast infection, itching, some discharge, slight odor,Sharp pain with urination on 07/22/17, lower back pain x 1 week )   Referred by No ref. provider found  Subjective:     HPI:  Jillian Garcia is a 27 y.o. female she presents today with complaints of vaginal itching. She has been having pain with urination and lower back pain as well.    Past Medical History:  Diagnosis Date  . BV (bacterial vaginosis)   . Chlamydia 10/2016   Family History  Problem Relation Age of Onset  . Hypertension Mother   . Diabetes Maternal Grandmother   . Breast cancer Paternal Grandmother   . Diabetes Maternal Aunt   . Diabetes Maternal Uncle    Past Surgical History:  Procedure Laterality Date  . KNEE SURGERY      Short Social History:  Social History   Tobacco Use  . Smoking status: Former Games developermoker  . Smokeless tobacco: Never Used  Substance Use Topics  . Alcohol use: No    No Known Allergies  Current Outpatient Medications  Medication Sig Dispense Refill  . Prenatal Vit-Fe Fumarate-FA (PRENATAL MULTIVITAMIN) TABS tablet Take 1 tablet by mouth daily at 12 noon.    . Probiotic Product (PROBIOTIC-10) CAPS Take by mouth.    . nitrofurantoin, macrocrystal-monohydrate, (MACROBID) 100 MG capsule Take 100 mg by mouth 2 (two) times daily.  0   No current facility-administered medications for this visit.     Review of Systems  Constitutional: Negative for chills, fatigue, fever and unexpected weight change.  HENT: Negative for trouble swallowing.  Eyes: Negative for loss of vision.  Respiratory: Negative for cough, shortness of breath and wheezing.  Cardiovascular: Negative for chest pain, leg swelling, palpitations and syncope.  GI: Negative for abdominal pain, blood in stool, diarrhea, nausea and vomiting.  GU: Negative for difficulty urinating, dysuria,  frequency and hematuria.  Musculoskeletal: Negative for back pain, leg pain and joint pain.  Skin: Negative for rash.  Neurological: Negative for dizziness, headaches, light-headedness, numbness and seizures.  Psychiatric: Negative for behavioral problem, confusion, depressed mood and sleep disturbance.        Objective:  Objective   Vitals:   07/24/17 1145  BP: 128/82  Pulse: 92  Weight: 293 lb (132.9 kg)  Height: 6\' 1"  (1.854 m)   Body mass index is 38.66 kg/m.  Physical Exam      Assessment/Plan:     27 yo with vaginitis versus UTI Will check urine culture and nuswab and treat based on results   Adelene Idlerhristanna Lareen Mullings MD, Merlinda FrederickFACOG [facility], [facility] 05/18/2020 9:28 AM

## 2017-07-26 LAB — URINE CULTURE

## 2017-08-03 ENCOUNTER — Other Ambulatory Visit: Payer: Self-pay | Admitting: Obstetrics and Gynecology

## 2017-08-03 DIAGNOSIS — B3731 Acute candidiasis of vulva and vagina: Secondary | ICD-10-CM

## 2017-08-03 DIAGNOSIS — B373 Candidiasis of vulva and vagina: Secondary | ICD-10-CM

## 2017-08-03 LAB — NUSWAB VAGINITIS PLUS (VG+)
Candida albicans, NAA: POSITIVE — AB
Candida glabrata, NAA: NEGATIVE
Chlamydia trachomatis, NAA: NEGATIVE
NEISSERIA GONORRHOEAE, NAA: NEGATIVE
Trich vag by NAA: NEGATIVE

## 2017-08-03 MED ORDER — FLUCONAZOLE 150 MG PO TABS: 150.0000 mg | ORAL_TABLET | ORAL | 0 refills | Status: DC

## 2017-08-03 NOTE — Progress Notes (Signed)
Yeast, prescription sent to pharmacy, note sent to mychart.

## 2017-08-04 ENCOUNTER — Encounter: Payer: Self-pay | Admitting: Obstetrics and Gynecology

## 2017-11-03 ENCOUNTER — Other Ambulatory Visit: Payer: Self-pay

## 2017-11-03 ENCOUNTER — Ambulatory Visit
Admission: EM | Admit: 2017-11-03 | Discharge: 2017-11-03 | Disposition: A | Discharge status: Home / Self Care | Payer: Worker's Compensation | Attending: Family Medicine | Admitting: Family Medicine

## 2017-11-03 ENCOUNTER — Encounter: Payer: Self-pay | Admitting: Emergency Medicine

## 2017-11-03 DIAGNOSIS — S29019A Strain of muscle and tendon of unspecified wall of thorax, initial encounter: Secondary | ICD-10-CM | POA: Diagnosis not present

## 2017-11-03 DIAGNOSIS — X58XXXA Exposure to other specified factors, initial encounter: Secondary | ICD-10-CM | POA: Diagnosis not present

## 2017-11-03 MED ORDER — NAPROXEN 500 MG PO TABS: 500.0000 mg | ORAL_TABLET | Freq: Two times a day (BID) | ORAL | 0 refills | Status: DC

## 2017-11-03 MED ORDER — METAXALONE 800 MG PO TABS: 800.0000 mg | ORAL_TABLET | Freq: Three times a day (TID) | ORAL | 0 refills | Status: DC

## 2017-11-03 NOTE — ED Triage Notes (Signed)
Patient c/o muscle strain in her upper back yesterday while at work. Patient describes the pain as a sharp pain. Patient stated she was lifting a stack of prints when she injured her back.

## 2017-11-03 NOTE — Discharge Instructions (Signed)
Avoid symptoms as much as possible.  Try to alter the ergonomics of your job to avoid stressing the area between your shoulder blades.Apply ice 20 minutes out of every 2 hours 4-5 times daily for comfort. Use  Caution while taking muscle relaxers.  Do not perform activities requiring concentration or judgment and do not drive.

## 2017-11-03 NOTE — ED Provider Notes (Signed)
[facility]    CSN: [id number] Arrival date & time: 11/03/17  1528     History   Chief Complaint Chief Complaint  Patient presents with  . Muscle Pain    W/C DOI 11/02/2017    HPI Jillian Garcia is a 27 y.o. female.   HPI  27 year old female presents with left sided intrascapular back pain that occurred yesterday while at work.  She describes the pain as sharp.  He was lifting a stack of prints required by her job when she injured her back.  Had a very similar episode in the past which is well-documented on 08/12/2016.  She is dominant left-handed and her injury is on the left side.  She states that she does this repetitively throughout the day.  No radicular component.           Past Medical History:  Diagnosis Date  . BV (bacterial vaginosis)   . Chlamydia 10/2016    Patient Active Problem List   Diagnosis Date Noted  . Chlamydia 11/24/2016    Past Surgical History:  Procedure Laterality Date  . KNEE SURGERY      OB History    Gravida  0   Para  0   Term  0   Preterm  0   AB  0   Living  0     SAB  0   TAB  0   Ectopic  0   Multiple  0   Live Births  0            Home Medications    Prior to Admission medications   Medication Sig Start Date End Date Taking? Authorizing Provider  metaxalone (SKELAXIN) 800 MG tablet Take 1 tablet (800 mg total) by mouth 3 (three) times daily. 11/03/17   Lutricia Feil, PA-C  naproxen (NAPROSYN) 500 MG tablet Take 1 tablet (500 mg total) by mouth 2 (two) times daily with a meal. 11/03/17   Lutricia Feil, PA-C    Family History Family History  Problem Relation Age of Onset  . Hypertension Mother   . Diabetes Maternal Grandmother   . Breast cancer Paternal Grandmother   . Diabetes Maternal Aunt   . Diabetes Maternal Uncle     Social History Social History   Tobacco Use  . Smoking status: Former Games developer  . Smokeless tobacco: Never Used  Substance Use Topics  . Alcohol  use: Not Currently  . Drug use: No     Allergies   Patient has no known allergies.   Review of Systems Review of Systems  Constitutional: Positive for activity change. Negative for appetite change, chills, fatigue and fever.  Musculoskeletal: Positive for back pain and myalgias.  All other systems reviewed and are negative.    Physical Exam Triage Vital Signs ED Triage Vitals  Enc Vitals Group     BP 11/03/17 1544 133/68     Pulse Rate 11/03/17 1544 84     Resp 11/03/17 1544 18     Temp 11/03/17 1544 98.7 F (37.1 C)     Temp Source 11/03/17 1544 Oral     SpO2 11/03/17 1544 100 %     Weight 11/03/17 1543 290 lb (131.5 kg)     Height 11/03/17 1543 6\' 1"  (1.854 m)     Head Circumference --      Peak Flow --      Pain Score 11/03/17 1542 7     Pain Loc --  Pain Edu? --      Excl. in GC? --    No data found.  Updated Vital Signs BP 133/68 (BP Location: Right Arm)   Pulse 84   Temp 98.7 F (37.1 C) (Oral)   Resp 18   Ht 6\' 1"  (1.854 m)   Wt 290 lb (131.5 kg)   LMP 10/26/2017   SpO2 100%   BMI 38.26 kg/m   Visual Acuity Right Eye Distance:   Left Eye Distance:   Bilateral Distance:    Right Eye Near:   Left Eye Near:    Bilateral Near:     Physical Exam  Constitutional: She is oriented to person, place, and time. She appears well-developed and well-nourished. No distress.  HENT:  Head: Normocephalic.  Eyes: Pupils are equal, round, and reactive to light.  Neck: Normal range of motion.  Pulmonary/Chest: Effort normal and breath sounds normal. She exhibits tenderness.  Musculoskeletal: Normal range of motion. She exhibits tenderness.  Examination thoracic spine shows tenderness in the muscles adjacent to the left sternal border near the scapular tip.  Reproduces her symptoms.  Drastic rotation to the left also reproduces symptoms.  She is not as tender towards the right.  To forward flex without discomfort.  Upper extremity sensation and strength are  intact throughout  Neurological: She is alert and oriented to person, place, and time.  Skin: Skin is warm and dry. She is not diaphoretic.  Psychiatric: She has a normal mood and affect. Her behavior is normal. Judgment and thought content normal.  Nursing note and vitals reviewed.    UC Treatments / Results  Labs (all labs ordered are listed, but only abnormal results are displayed) Labs Reviewed - No data to display  EKG None  Radiology No results found.  Procedures Procedures (including critical care time)  Medications Ordered in UC Medications - No data to display  Initial Impression / Assessment and Plan / UC Course  I have reviewed the triage vital signs and the nursing notes.  Pertinent labs & imaging results that were available during my care of the patient were reviewed by me and considered in my medical decision making (see chart for details).     Reviewed rest and symptom avoidance.  Recommend ice for the next 2 to 3 days and then may alternate between ice and heat.  Place her on Naprosyn and Skelaxin with appropriate precautions.  Follow-up would be at [facility] regional occupational health.  Given her restrictions for 7 days. Final Clinical Impressions(s) / UC Diagnoses   Final diagnoses:  Thoracic myofascial strain, initial encounter     Discharge Instructions     Avoid symptoms as much as possible.  Try to alter the ergonomics of your job to avoid stressing the area between your shoulder blades.Apply ice 20 minutes out of every 2 hours 4-5 times daily for comfort. Use  Caution while taking muscle relaxers.  Do not perform activities requiring concentration or judgment and do not drive.     ED Prescriptions    Medication Sig Dispense Auth. Provider   naproxen (NAPROSYN) 500 MG tablet Take 1 tablet (500 mg total) by mouth 2 (two) times daily with a meal. 60 tablet Lutricia Feil, PA-C   metaxalone (SKELAXIN) 800 MG tablet Take 1 tablet (800 mg total)  by mouth 3 (three) times daily. 21 tablet Lutricia Feil, PA-C     Controlled Substance Prescriptions [place] Controlled Substance Registry consulted? Not Applicable   Lutricia Feil, PA-C  11/03/17 1741  

## 2017-12-13 ENCOUNTER — Encounter: Payer: Self-pay | Admitting: Emergency Medicine

## 2017-12-13 DIAGNOSIS — R109 Unspecified abdominal pain: Secondary | ICD-10-CM | POA: Insufficient documentation

## 2017-12-13 DIAGNOSIS — R300 Dysuria: Secondary | ICD-10-CM | POA: Insufficient documentation

## 2017-12-13 DIAGNOSIS — Z5321 Procedure and treatment not carried out due to patient leaving prior to being seen by health care provider: Secondary | ICD-10-CM | POA: Insufficient documentation

## 2017-12-13 LAB — URINALYSIS, COMPLETE (UACMP) WITH MICROSCOPIC
Bilirubin Urine: NEGATIVE
Glucose, UA: NEGATIVE mg/dL
Hgb urine dipstick: NEGATIVE
KETONES UR: NEGATIVE mg/dL
Leukocytes, UA: NEGATIVE
Nitrite: NEGATIVE
PH: 5 (ref 5.0–8.0)
Protein, ur: NEGATIVE mg/dL
SPECIFIC GRAVITY, URINE: 1.016 (ref 1.005–1.030)

## 2017-12-13 LAB — CBC
HCT: 36.7 % (ref 36.0–46.0)
Hemoglobin: 12.1 g/dL (ref 12.0–15.0)
MCH: 26.5 pg (ref 26.0–34.0)
MCHC: 33 g/dL (ref 30.0–36.0)
MCV: 80.3 fL (ref 80.0–100.0)
Platelets: 293 10*3/uL (ref 150–400)
RBC: 4.57 MIL/uL (ref 3.87–5.11)
RDW: 13.5 % (ref 11.5–15.5)
WBC: 4.6 10*3/uL (ref 4.0–10.5)
nRBC: 0 % (ref 0.0–0.2)

## 2017-12-13 LAB — COMPREHENSIVE METABOLIC PANEL
ALK PHOS: 81 U/L (ref 38–126)
ALT: 23 U/L (ref 0–44)
ANION GAP: 5 (ref 5–15)
AST: 29 U/L (ref 15–41)
Albumin: 4 g/dL (ref 3.5–5.0)
BUN: 8 mg/dL (ref 6–20)
CALCIUM: 8.9 mg/dL (ref 8.9–10.3)
CO2: 30 mmol/L (ref 22–32)
Chloride: 106 mmol/L (ref 98–111)
Creatinine, Ser: 0.7 mg/dL (ref 0.44–1.00)
GFR calc Af Amer: >60 mL/min (ref >60)
GFR calc non Af Amer: >60 mL/min (ref >60)
Glucose, Bld: 103 mg/dL — ABNORMAL HIGH (ref 70–99)
Potassium: 3.9 mmol/L (ref 3.5–5.1)
SODIUM: 141 mmol/L (ref 135–145)
Total Bilirubin: 0.3 mg/dL (ref 0.3–1.2)
Total Protein: 7 g/dL (ref 6.5–8.1)

## 2017-12-13 LAB — LIPASE, BLOOD: Lipase: 41 U/L (ref 11–51)

## 2017-12-13 NOTE — ED Triage Notes (Addendum)
Patient c/o bladder pressure, strong smelling urine, and dysuria. Patient also c/o abdominal pain and nausea

## 2017-12-14 ENCOUNTER — Emergency Department
Admission: EM | Admit: 2017-12-14 | Discharge: 2017-12-14 | Disposition: A | Discharge status: Home / Self Care | Payer: Self-pay | Attending: Emergency Medicine | Admitting: Emergency Medicine

## 2017-12-14 ENCOUNTER — Telehealth: Payer: Self-pay | Admitting: Emergency Medicine

## 2017-12-14 NOTE — Telephone Encounter (Addendum)
Called patient due to lwot to inquire about condition and follow up plans.  Left message.  Patient called me back.  Continues to have symptoms.  expained that she really needs to see a provider for exam for diagnosis.  Gave options for care including acute care, urgent care or here.  She understands that the lab results will be available.

## 2018-01-25 ENCOUNTER — Ambulatory Visit: Payer: Commercial Managed Care - PPO | Admitting: Obstetrics and Gynecology

## 2018-03-19 ENCOUNTER — Encounter: Payer: Self-pay | Admitting: Obstetrics and Gynecology

## 2018-03-19 ENCOUNTER — Ambulatory Visit (INDEPENDENT_AMBULATORY_CARE_PROVIDER_SITE_OTHER): Payer: BLUE CROSS/BLUE SHIELD | Admitting: Obstetrics and Gynecology

## 2018-03-19 VITALS — BP 114/70 | HR 64 | Ht 73.0 in | Wt 271.0 lb

## 2018-03-19 DIAGNOSIS — N760 Acute vaginitis: Secondary | ICD-10-CM | POA: Diagnosis not present

## 2018-03-19 DIAGNOSIS — Z1329 Encounter for screening for other suspected endocrine disorder: Secondary | ICD-10-CM | POA: Diagnosis not present

## 2018-03-19 DIAGNOSIS — Z130 Encounter for screening for diseases of the blood and blood-forming organs and certain disorders involving the immune mechanism: Secondary | ICD-10-CM

## 2018-03-19 DIAGNOSIS — R5383 Other fatigue: Secondary | ICD-10-CM

## 2018-03-19 DIAGNOSIS — Z124 Encounter for screening for malignant neoplasm of cervix: Secondary | ICD-10-CM

## 2018-03-19 DIAGNOSIS — Z113 Encounter for screening for infections with a predominantly sexual mode of transmission: Secondary | ICD-10-CM

## 2018-03-19 DIAGNOSIS — Z0000 Encounter for general adult medical examination without abnormal findings: Secondary | ICD-10-CM | POA: Diagnosis not present

## 2018-03-19 DIAGNOSIS — Z01419 Encounter for gynecological examination (general) (routine) without abnormal findings: Secondary | ICD-10-CM

## 2018-03-19 DIAGNOSIS — Z131 Encounter for screening for diabetes mellitus: Secondary | ICD-10-CM

## 2018-03-19 DIAGNOSIS — G479 Sleep disorder, unspecified: Secondary | ICD-10-CM

## 2018-03-19 DIAGNOSIS — R300 Dysuria: Secondary | ICD-10-CM

## 2018-03-19 DIAGNOSIS — B360 Pityriasis versicolor: Secondary | ICD-10-CM

## 2018-03-19 LAB — POCT URINALYSIS DIPSTICK
BILIRUBIN UA: NEGATIVE
Glucose, UA: NEGATIVE
Ketones, UA: NEGATIVE
Nitrite, UA: NEGATIVE
Protein, UA: NEGATIVE
Spec Grav, UA: 1.015 (ref 1.010–1.025)
UROBILINOGEN UA: 0.2 U/dL
pH, UA: 8 (ref 5.0–8.0)

## 2018-03-19 MED ORDER — CLOTRIMAZOLE 2 % VA CREA: 1.0000 | TOPICAL_CREAM | Freq: Every day | VAGINAL | 11 refills | Status: DC

## 2018-03-19 NOTE — Patient Instructions (Signed)
Vaginal Yeast infection, Adult    Vaginal yeast infection is a condition that causes vaginal discharge as well as soreness, swelling, and redness (inflammation) of the vagina. This is a common condition. Some women get this infection frequently.  What are the causes?  This condition is caused by a change in the normal balance of the yeast (candida) and bacteria that live in the vagina. This change causes an overgrowth of yeast, which causes the inflammation.  What increases the risk?  The condition is more likely to develop in women who:   Take antibiotic medicines.   Have diabetes.   Take birth control pills.   Are pregnant.   Douche often.   Have a weak body defense system (immune system).   Have been taking steroid medicines for a long time.   Frequently wear tight clothing.  What are the signs or symptoms?  Symptoms of this condition include:   White, thick, creamy vaginal discharge.   Swelling, itching, redness, and irritation of the vagina. The lips of the vagina (vulva) may be affected as well.   Pain or a burning feeling while urinating.   Pain during sex.  How is this diagnosed?  This condition is diagnosed based on:   Your medical history.   A physical exam.   A pelvic exam. Your health care provider will examine a sample of your vaginal discharge under a microscope. Your health care provider may send this sample for testing to confirm the diagnosis.  How is this treated?  This condition is treated with medicine. Medicines may be over-the-counter or prescription. You may be told to use one or more of the following:   Medicine that is taken by mouth (orally).   Medicine that is applied as a cream (topically).   Medicine that is inserted directly into the vagina (suppository).  Follow these instructions at home:    Lifestyle   Do not have sex until your health care provider approves. Tell your sex partner that you have a yeast infection. That person should go to his or her health care  provider and ask if they should also be treated.   Do not wear tight clothes, such as pantyhose or tight pants.   Wear breathable cotton underwear.  General instructions   Take or apply over-the-counter and prescription medicines only as told by your health care provider.   Eat more yogurt. This may help to keep your yeast infection from returning.   Do not use tampons until your health care provider approves.   Try taking a sitz bath to help with discomfort. This is a warm water bath that is taken while you are sitting down. The water should only come up to your hips and should cover your buttocks. Do this 3-4 times per day or as told by your health care provider.   Do not douche.   If you have diabetes, keep your blood sugar levels under control.   Keep all follow-up visits as told by your health care provider. This is important.  Contact a health care provider if:   You have a fever.   Your symptoms go away and then return.   Your symptoms do not get better with treatment.   Your symptoms get worse.   You have new symptoms.   You develop blisters in or around your vagina.   You have blood coming from your vagina and it is not your menstrual period.   You develop pain in your abdomen.  Summary     Vaginal yeast infection is a condition that causes discharge as well as soreness, swelling, and redness (inflammation) of the vagina.   This condition is treated with medicine. Medicines may be over-the-counter or prescription.   Take or apply over-the-counter and prescription medicines only as told by your health care provider.   Do not douche. Do not have sex or use tampons until your health care provider approves.   Contact a health care provider if your symptoms do not get better with treatment or your symptoms go away and then return.  This information is not intended to replace advice given to you by your health care provider. Make sure you discuss any questions you have with your health care  provider.  Document Released: 10/23/2004 Document Revised: 06/01/2017 Document Reviewed: 06/01/2017  [employer]vier Interactive Patient Education  2019 [employer]

## 2018-03-19 NOTE — Progress Notes (Signed)
Gynecology Annual Exam   PCP: Patient, No Pcp Per  Chief Complaint:  Chief Complaint  Patient presents with  . Gynecologic Exam    History of Present Illness: Patient is a 28 y.o. G0P0000 presents for annual exam. The patient has no complaints today.   LMP: No LMP recorded. Average Interval: regular, 28 days Duration of flow: 5 days Heavy Menses: no Clots: no Intermenstrual Bleeding: no Postcoital Bleeding: no Dysmenorrhea: no  The patient is sexually active. She currently uses none for contraception. She denies dyspareunia.  The patient does perform self breast exams.  There is no notable family history of breast or ovarian cancer in her family.  The patient wears seatbelts: no.   The patient has regular exercise: yes.    The patient denies current symptoms of depression.    Review of Systems: Review of Systems  Constitutional: Negative for chills, fever, malaise/fatigue and weight loss.  HENT: Negative for congestion, hearing loss and sinus pain.   Eyes: Negative for blurred vision and double vision.  Respiratory: Negative for cough, sputum production, shortness of breath and wheezing.   Cardiovascular: Negative for chest pain, palpitations, orthopnea and leg swelling.  Gastrointestinal: Negative for abdominal pain, constipation, diarrhea, nausea and vomiting.  Genitourinary: Negative for dysuria, flank pain, frequency, hematuria and urgency.  Musculoskeletal: Negative for back pain, falls and joint pain.  Skin: Negative for itching and rash.  Neurological: Negative for dizziness and headaches.  Psychiatric/Behavioral: Negative for depression, substance abuse and suicidal ideas. The patient is not nervous/anxious.     Past Medical History:  Past Medical History:  Diagnosis Date  . BV (bacterial vaginosis)   . Chlamydia 10/2016    Past Surgical History:  Past Surgical History:  Procedure Laterality Date  . KNEE SURGERY      Gynecologic History:  No LMP  recorded. Contraception: condoms Last Pap: Results were: NIL 2018   Obstetric History: G0P0000  Family History:  Family History  Problem Relation Age of Onset  . Hypertension Mother   . Diabetes Maternal Grandmother   . Breast cancer Paternal Grandmother   . Diabetes Maternal Aunt   . Diabetes Maternal Uncle     Social History:  Social History   Socioeconomic History  . Marital status: Media planner    Spouse name: Not on file  . Number of children: Not on file  . Years of education: Not on file  . Highest education level: Not on file  Occupational History  . Not on file  Social Needs  . Financial resource strain: Not on file  . Food insecurity:    Worry: Not on file    Inability: Not on file  . Transportation needs:    Medical: Not on file    Non-medical: Not on file  Tobacco Use  . Smoking status: Former Games developer  . Smokeless tobacco: Never Used  Substance and Sexual Activity  . Alcohol use: Not Currently  . Drug use: No  . Sexual activity: Yes    Birth control/protection: None  Lifestyle  . Physical activity:    Days per week: Not on file    Minutes per session: Not on file  . Stress: Not on file  Relationships  . Social connections:    Talks on phone: Not on file    Gets together: Not on file    Attends religious service: Not on file    Active member of club or organization: Not on file    Attends meetings  of clubs or organizations: Not on file    Relationship status: Not on file  . Intimate partner violence:    Fear of current or ex partner: Not on file    Emotionally abused: Not on file    Physically abused: Not on file    Forced sexual activity: Not on file  Other Topics Concern  . Not on file  Social History Narrative  . Not on file    Allergies:  No Known Allergies  Medications: Prior to Admission medications   Medication Sig Start Date End Date Taking? Authorizing Provider  metaxalone (SKELAXIN) 800 MG tablet Take 1 tablet (800 mg  total) by mouth 3 (three) times daily. 11/03/17   Lutricia Feiloemer, William P, PA-C  naproxen (NAPROSYN) 500 MG tablet Take 1 tablet (500 mg total) by mouth 2 (two) times daily with a meal. 11/03/17   Lutricia Feiloemer, William P, PA-C    Physical Exam Vitals: Weight 271 lb (122.9 kg).  General: NAD HEENT: normocephalic, anicteric Thyroid: no enlargement, no palpable nodules Pulmonary: No increased work of breathing, CTAB Cardiovascular: RRR, distal pulses 2+ Breast: Breast symmetrical, no tenderness, no palpable nodules or masses, no skin or nipple retraction present, no nipple discharge.  No axillary or supraclavicular lymphadenopathy. Abdomen: NABS, soft, non-tender, non-distended.  Umbilicus without lesions.  No hepatomegaly, splenomegaly or masses palpable. No evidence of hernia  Genitourinary:  External: Normal external female genitalia.  White  Rash apparent on labia. Normal urethral meatus, normal Bartholin's and Skene's glands.    Vagina: Normal vaginal mucosa, no evidence of prolapse.  Scant white discharge.  Cervix: Grossly normal in appearance, no bleeding  Uterus: Non-enlarged, mobile, normal contour.  No CMT  Adnexa: ovaries non-enlarged, no adnexal masses  Rectal: deferred  Lymphatic: no evidence of inguinal lymphadenopathy Extremities: no edema, erythema, or tenderness Neurologic: Grossly intact Psychiatric: mood appropriate, affect full  Female chaperone present for pelvic and breast  portions of the physical exam    Assessment: 28 y.o. G0P0000 routine annual exam  Plan: Problem List Items Addressed This Visit    None    Visit Diagnoses    Screening for cervical cancer    -  Primary   Encounter for gynecological examination without abnormal finding       Healthcare maintenance          2) STI screening  was offered and accepted. She would like to be tested for HSV. She was having unrotected intercourse with a partner who has HSV.   2)  ASCCP guidelines and rational discussed.   Patient opts for every 3 years screening interval  3) Contraception - the patient is currently using  none.  She is happy with her current form of contraception and plans to continue.   4) Routine healthcare maintenance including thyroid and diabetes screening discussed. Ordered today.   5) Rash on back suggestive of tinea versicolor- referral to dermatology made.  6) Recurrent Vulvovaginal yeast infections, more than 3 in the last year. Patient having extreme itching of vulva and clumpy white vaginal discharge. Itching is causing fissures. She would like to be on suppressive therapy. She prefers a vaginal insert medication to an oral pill. She will do twice weekly clomitrazole suppositories.    7) Patient complains of fatigue- she works second shift and often does not go to be until 2 or 3 am. She does not have curtains and sleeps in a room with a lot of natural light. Discussed that curtains to darken the room would  help improve the quality of her sleep, as well as an earlier bedtime although this is difficult given her job schedule.   8)  Return in about 1 year (around 03/20/2019) for annual.  Patient here for annual well visit, but had significant separate complaints that were addressed today.  More than 60 minutes were spent face to face with the patient in the room with more than 50% of the time spent providing counseling and discussing the plan of management.    Adelene Idler MD [facility], [facility] 03/19/2018 1:34 PM

## 2018-03-22 DIAGNOSIS — R300 Dysuria: Secondary | ICD-10-CM | POA: Diagnosis not present

## 2018-03-23 LAB — HSV(HERPES SMPLX)ABS-I+II(IGG+IGM)-BLD
HSV 1 Glycoprotein G Ab, IgG: 46.1 index — ABNORMAL HIGH (ref 0.00–0.90)
HSV 2 IgG, Type Spec: <0.91 index (ref 0.00–0.90)
HSVI/II Comb IgM: <0.91 Ratio (ref 0.00–0.90)

## 2018-03-23 LAB — CBC
HEMOGLOBIN: 12.3 g/dL (ref 11.1–15.9)
Hematocrit: 36.2 % (ref 34.0–46.6)
MCH: 26.9 pg (ref 26.6–33.0)
MCHC: 34 g/dL (ref 31.5–35.7)
MCV: 79 fL (ref 79–97)
Platelets: 413 10*3/uL (ref 150–450)
RBC: 4.58 x10E6/uL (ref 3.77–5.28)
RDW: 13.9 % (ref 11.7–15.4)
WBC: 8.4 10*3/uL (ref 3.4–10.8)

## 2018-03-23 LAB — HEMOGLOBIN A1C
Est. average glucose Bld gHb Est-mCnc: 105 mg/dL
Hgb A1c MFr Bld: 5.3 % (ref 4.8–5.6)

## 2018-03-23 LAB — T4, FREE: Free T4: 1.18 ng/dL (ref 0.82–1.77)

## 2018-03-23 LAB — TSH: TSH: 0.851 u[IU]/mL (ref 0.450–4.500)

## 2018-03-23 LAB — T3, FREE: T3, Free: 2.7 pg/mL (ref 2.0–4.4)

## 2018-03-25 LAB — URINE CULTURE

## 2018-03-26 ENCOUNTER — Other Ambulatory Visit: Payer: Self-pay | Admitting: Obstetrics and Gynecology

## 2018-03-26 DIAGNOSIS — B009 Herpesviral infection, unspecified: Secondary | ICD-10-CM

## 2018-03-26 DIAGNOSIS — N3000 Acute cystitis without hematuria: Secondary | ICD-10-CM

## 2018-03-26 MED ORDER — SULFAMETHOXAZOLE-TRIMETHOPRIM 800-160 MG PO TABS: 1.0000 | ORAL_TABLET | Freq: Two times a day (BID) | ORAL | 0 refills | Status: DC

## 2018-03-26 MED ORDER — VALACYCLOVIR HCL 1 G PO TABS: 1000.0000 mg | ORAL_TABLET | Freq: Every day | ORAL | 12 refills | Status: DC

## 2018-03-26 NOTE — Progress Notes (Signed)
Called, left message to check mychart. RX sent to pharmacy

## 2018-04-05 DIAGNOSIS — B360 Pityriasis versicolor: Secondary | ICD-10-CM | POA: Diagnosis not present

## 2018-04-05 DIAGNOSIS — L700 Acne vulgaris: Secondary | ICD-10-CM | POA: Diagnosis not present

## 2018-04-06 ENCOUNTER — Telehealth: Payer: Self-pay

## 2018-04-06 NOTE — Telephone Encounter (Signed)
Do you need to see her?

## 2018-04-06 NOTE — Telephone Encounter (Signed)
Called and discussed with patient. She needs to be seen for a visit, transferred to Sarah to schedule appointment.

## 2018-04-06 NOTE — Telephone Encounter (Signed)
Pt calling triage line stating she has had 3 periods in 1 month. She was recently seen in February x2 by [facility] and would like a call back to discuss.  CB# [phone]

## 2018-04-07 ENCOUNTER — Ambulatory Visit (INDEPENDENT_AMBULATORY_CARE_PROVIDER_SITE_OTHER): Payer: BLUE CROSS/BLUE SHIELD | Admitting: Obstetrics and Gynecology

## 2018-04-07 ENCOUNTER — Encounter: Payer: Self-pay | Admitting: Obstetrics and Gynecology

## 2018-04-07 ENCOUNTER — Other Ambulatory Visit: Payer: Self-pay

## 2018-04-07 VITALS — BP 112/60 | HR 66 | Ht 73.0 in | Wt 266.0 lb

## 2018-04-07 DIAGNOSIS — B373 Candidiasis of vulva and vagina: Secondary | ICD-10-CM

## 2018-04-07 DIAGNOSIS — N921 Excessive and frequent menstruation with irregular cycle: Secondary | ICD-10-CM

## 2018-04-07 DIAGNOSIS — N939 Abnormal uterine and vaginal bleeding, unspecified: Secondary | ICD-10-CM | POA: Diagnosis not present

## 2018-04-07 DIAGNOSIS — B3731 Acute candidiasis of vulva and vagina: Secondary | ICD-10-CM

## 2018-04-07 DIAGNOSIS — N761 Subacute and chronic vaginitis: Secondary | ICD-10-CM | POA: Diagnosis not present

## 2018-04-07 MED ORDER — FLUCONAZOLE 150 MG PO TABS: 150.0000 mg | ORAL_TABLET | ORAL | 1 refills | Status: DC

## 2018-04-07 NOTE — Progress Notes (Signed)
Patient ID: Jillian Garcia, female   DOB: [birth year], 28 y.o.   MRN: [id number]  Reason for Consult: Follow-up (Itching is better, period has come 3 times in less then a month)   Referred by No ref. provider found  Subjective:     HPI:  Jillian Garcia is a 28 y.o. female   She is here today because she has had new onset abnormal uterine bleeding.  She reports that she has had bleeding February 8th-13th, then 24-29th, and again March 7th-10th. She reports that she changes her bad every 3-4 hours when she uses the bathroom, it is usually saturated. She is not passing large clots. She has not had any fainting or syncope. She is not on anticoagulation she has not recently started new medication. She does not take birth control She does not have a history of fibroids or polyps.   Recently normal CBC, TSH, negative chlamydia test, NIL pap in 2018.  UPT today negative  She is very hestistent to initiate and hormonal or non-hormonal medications to help with her bleeding.   She is concerned that she may not be able to get pregnant. However, she reports that her boyfriend is moving out of her apartment because she doesn't like his living habits. She indicates that they are still together and their relationship seems to be improving. She feels like she needs to stay with him because he gave her HSV-1 and she doesn't want to pass that on to future partners. "I feel guilty because I knew he had herpes but I let him convince me that he was taking the medication and that I wouldn't get it but now I have herpes and I'm afraid of not being able tot kiss my nephews and nieces."  She says she wouldn't mind being pregnant and having a baby. "I'm starting to catch some of that baby fever." But she seems to acknowledge that she isn't currently happy in her relationship.   Dose not want Depo Injections Has had a nexplanon, not happy Has had a skyla, not happy with it Does not desires OCPs.   Past Medical  History:  Diagnosis Date  . BV (bacterial vaginosis)   . Chlamydia 10/2016   Family History  Problem Relation Age of Onset  . Hypertension Mother   . Diabetes Maternal Grandmother   . Breast cancer Paternal Grandmother 23  . Diabetes Maternal Aunt   . Diabetes Maternal Uncle    Past Surgical History:  Procedure Laterality Date  . KNEE SURGERY      Short Social History:  Social History   Tobacco Use  . Smoking status: Former Games developer  . Smokeless tobacco: Never Used  Substance Use Topics  . Alcohol use: Not Currently    No Known Allergies  Current Outpatient Medications  Medication Sig Dispense Refill  . Cholecalciferol (VITAMIN D3) 1.25 MG (50000 UT) TABS Take by mouth.    Marland Kitchen ketoconazole (NIZORAL) 2 % cream     . Multiple Vitamins-Minerals (MULTIVITAMIN WITH MINERALS) tablet Take 1 tablet by mouth daily.    Marland Kitchen sulfamethoxazole-trimethoprim (BACTRIM DS) 800-160 MG tablet Take 1 tablet by mouth 2 (two) times daily. 6 tablet 0  . valACYclovir (VALTREX) 1000 MG tablet Take 1 tablet (1,000 mg total) by mouth daily. 30 tablet 12  . clotrimazole (GYNE-LOTRIMIN 3) 2 % vaginal cream Place 1 Applicatorful vaginally at bedtime for 3 days. For 6 months use vaginal applicator twice a week 21 g 11  . fluconazole (DIFLUCAN) 150  MG tablet Take 1 tablet (150 mg total) by mouth once a week. 30 tablet 1   No current facility-administered medications for this visit.     Review of Systems  Constitutional: Negative for chills, fatigue, fever and unexpected weight change.  HENT: Negative for trouble swallowing.  Eyes: Negative for loss of vision.  Respiratory: Negative for cough, shortness of breath and wheezing.  Cardiovascular: Negative for chest pain, leg swelling, palpitations and syncope.  GI: Negative for abdominal pain, blood in stool, diarrhea, nausea and vomiting.  GU: Negative for difficulty urinating, dysuria, frequency and hematuria.  Musculoskeletal: Negative for back pain, leg  pain and joint pain.  Skin: Negative for rash.  Neurological: Negative for dizziness, headaches, light-headedness, numbness and seizures.  Psychiatric: Negative for behavioral problem, confusion, depressed mood and sleep disturbance.        Objective:  Objective   Vitals:   04/07/18 1136  BP: 112/60  Pulse: 66  Weight: 266 lb (120.7 kg)  Height: 6\' 1"  (1.854 m)   Body mass index is 35.09 kg/m.  Physical Exam Vitals signs and nursing note reviewed.  Constitutional:      Appearance: She is well-developed.  HENT:     Head: Normocephalic and atraumatic.  Eyes:     Pupils: Pupils are equal, round, and reactive to light.  Cardiovascular:     Rate and Rhythm: Normal rate and regular rhythm.  Pulmonary:     Effort: Pulmonary effort is normal. No respiratory distress.  Skin:    General: Skin is warm and dry.  Neurological:     Mental Status: She is alert and oriented to person, place, and time.  Psychiatric:        Behavior: Behavior normal.        Thought Content: Thought content normal.        Judgment: Judgment normal.         Assessment/Plan:     28 yo with irregular and heavy menstrual bleeding   Upset about HSV- reassurance and education about prevalence and transmission Rx sent for oral yeast medication for recurrent yeast infections Recently normal CBC, TSH, negative chlamydia test, NIL pap in 2018.  UPT today negative Will follow up for pelvic US She will consider her options for hormonal medication, considering patch. Discussed tranexamic acid and naproxen.  She has PCOS testing labs last year which were normal.   More than 60 minutes were spent face to face with the patient in the room with more than 50% of the time spent providing counseling and discussing the plan of management. We discussed her concerns about HSV, infertility, abnormal bleeding and her social concerns.    Adelene Idler MD [facility], [facility] 04/07/2018  1:24 PM

## 2018-04-13 ENCOUNTER — Ambulatory Visit: Payer: BLUE CROSS/BLUE SHIELD

## 2018-04-16 ENCOUNTER — Ambulatory Visit: Payer: BLUE CROSS/BLUE SHIELD

## 2018-04-21 ENCOUNTER — Ambulatory Visit: Payer: BLUE CROSS/BLUE SHIELD | Admitting: Obstetrics and Gynecology

## 2018-04-30 DIAGNOSIS — M25571 Pain in right ankle and joints of right foot: Secondary | ICD-10-CM | POA: Diagnosis not present

## 2018-04-30 DIAGNOSIS — M2140 Flat foot [pes planus] (acquired), unspecified foot: Secondary | ICD-10-CM | POA: Diagnosis not present

## 2018-04-30 DIAGNOSIS — M76829 Posterior tibial tendinitis, unspecified leg: Secondary | ICD-10-CM | POA: Diagnosis not present

## 2018-05-17 ENCOUNTER — Ambulatory Visit: Payer: BLUE CROSS/BLUE SHIELD

## 2019-03-04 DIAGNOSIS — J0100 Acute maxillary sinusitis, unspecified: Secondary | ICD-10-CM | POA: Diagnosis not present

## 2019-03-21 ENCOUNTER — Other Ambulatory Visit: Payer: Self-pay

## 2019-03-21 ENCOUNTER — Other Ambulatory Visit (HOSPITAL_COMMUNITY)
Admission: RE | Admit: 2019-03-21 | Discharge: 2019-03-21 | Disposition: A | Discharge status: Home / Self Care | Payer: BC Managed Care – PPO | Source: Ambulatory Visit | Attending: Obstetrics and Gynecology | Admitting: Obstetrics and Gynecology

## 2019-03-21 ENCOUNTER — Ambulatory Visit (INDEPENDENT_AMBULATORY_CARE_PROVIDER_SITE_OTHER): Payer: BC Managed Care – PPO | Admitting: Obstetrics and Gynecology

## 2019-03-21 ENCOUNTER — Encounter: Payer: Self-pay | Admitting: Obstetrics and Gynecology

## 2019-03-21 VITALS — BP 110/78 | Ht 73.0 in | Wt 273.0 lb

## 2019-03-21 DIAGNOSIS — Z01411 Encounter for gynecological examination (general) (routine) with abnormal findings: Secondary | ICD-10-CM

## 2019-03-21 DIAGNOSIS — Z124 Encounter for screening for malignant neoplasm of cervix: Secondary | ICD-10-CM | POA: Diagnosis not present

## 2019-03-21 DIAGNOSIS — N761 Subacute and chronic vaginitis: Secondary | ICD-10-CM

## 2019-03-21 DIAGNOSIS — B373 Candidiasis of vulva and vagina: Secondary | ICD-10-CM | POA: Diagnosis not present

## 2019-03-21 DIAGNOSIS — B3731 Acute candidiasis of vulva and vagina: Secondary | ICD-10-CM

## 2019-03-21 DIAGNOSIS — Z0000 Encounter for general adult medical examination without abnormal findings: Secondary | ICD-10-CM

## 2019-03-21 NOTE — Patient Instructions (Signed)
Institute of Medicine Recommended Dietary Allowances for Calcium and Vitamin D  Age (yr) Calcium Recommended Dietary Allowance (mg/day) Vitamin D Recommended Dietary Allowance (international units/day)  9-18 1,300 600  19-50 1,000 600  51-70 1,200 600  71 and older 1,200 800  Data from Institute of Medicine. Dietary reference intakes: calcium, vitamin D. [place], [place]: National Academies Press; 2011.     Exercising to Stay Healthy To become healthy and stay healthy, it is recommended that you do moderate-intensity and vigorous-intensity exercise. You can tell that you are exercising at a moderate intensity if your heart starts beating faster and you start breathing faster but can still hold a conversation. You can tell that you are exercising at a vigorous intensity if you are breathing much harder and faster and cannot hold a conversation while exercising. Exercising regularly is important. It has many health benefits, such as:  Improving overall fitness, flexibility, and endurance.  Increasing bone density.  Helping with weight control.  Decreasing body fat.  Increasing muscle strength.  Reducing stress and tension.  Improving overall health. How often should I exercise? Choose an activity that you enjoy, and set realistic goals. Your health care provider can help you make an activity plan that works for you. Exercise regularly as told by your health care provider. This may include:  Doing strength training two times a week, such as: ? Lifting weights. ? Using resistance bands. ? Push-ups. ? Sit-ups. ? Yoga.  Doing a certain intensity of exercise for a given amount of time. Choose from these options: ? A total of 150 minutes of moderate-intensity exercise every week. ? A total of 75 minutes of vigorous-intensity exercise every week. ? A mix of moderate-intensity and vigorous-intensity exercise every week. Children, pregnant women, people who have not exercised  regularly, people who are overweight, and older adults may need to talk with a health care provider about what activities are safe to do. If you have a medical condition, be sure to talk with your health care provider before you start a new exercise program. What are some exercise ideas? Moderate-intensity exercise ideas include:  Walking 1 mile (1.6 km) in about 15 minutes.  Biking.  Hiking.  Golfing.  Dancing.  Water aerobics. Vigorous-intensity exercise ideas include:  Walking 4.5 miles (7.2 km) or more in about 1 hour.  Jogging or running 5 miles (8 km) in about 1 hour.  Biking 10 miles (16.1 km) or more in about 1 hour.  Lap swimming.  Roller-skating or in-line skating.  Cross-country skiing.  Vigorous competitive sports, such as football, basketball, and soccer.  Jumping rope.  Aerobic dancing. What are some everyday activities that can help me to get exercise?  Yard work, such as: ? Pushing a lawn mower. ? Raking and bagging leaves.  Washing your car.  Pushing a stroller.  Shoveling snow.  Gardening.  Washing windows or floors. How can I be more active in my day-to-day activities?  Use stairs instead of an elevator.  Take a walk during your lunch break.  If you drive, park your car farther away from your work or school.  If you take public transportation, get off one stop early and walk the rest of the way.  Stand up or walk around during all of your indoor phone calls.  Get up, stretch, and walk around every 30 minutes throughout the day.  Enjoy exercise with a friend. Support to continue exercising will help you keep a regular routine of activity. What guidelines can   I follow while exercising?  Before you start a new exercise program, talk with your health care provider.  Do not exercise so much that you hurt yourself, feel dizzy, or get very short of breath.  Wear comfortable clothes and wear shoes with good support.  Drink plenty of  water while you exercise to prevent dehydration or heat stroke.  Work out until your breathing and your heartbeat get faster. Where to find more information  [place] Department of Health and Human Services: www.hhs.gov  Centers for Disease Control and Prevention (CDC): www.cdc.gov Summary  Exercising regularly is important. It will improve your overall fitness, flexibility, and endurance.  Regular exercise also will improve your overall health. It can help you control your weight, reduce stress, and improve your bone density.  Do not exercise so much that you hurt yourself, feel dizzy, or get very short of breath.  Before you start a new exercise program, talk with your health care provider. This information is not intended to replace advice given to you by your health care provider. Make sure you discuss any questions you have with your health care provider. Document Revised: 12/26/2016 Document Reviewed: 12/04/2016 Elsevier Patient Education  2020 [employer].   

## 2019-03-21 NOTE — Progress Notes (Signed)
Gynecology Annual Exam   PCP: Patient, No Pcp Per  Chief Complaint:  Chief Complaint  Patient presents with  . Gynecologic Exam    History of Present Illness: Patient is a 29 y.o. G0P0000 presents for annual exam. The patient has no complaints today.   LMP: Patient's last menstrual period was 02/20/2019 (exact date). Average Interval: regular, 28 days Duration of flow: 6 days Heavy Menses: yes, usually heavy for 1 day Clots: yes Intermenstrual Bleeding: no Dysmenorrhea: yes, pain controlled with midol   The patient is sexually active. She currently uses coitus interruptus for contraception. She denies dyspareunia.  The patient does not perform self breast exams.  There is notable family history of breast or ovarian cancer in her family.  The patient wears seatbelts: yes.   The patient has regular exercise: yes.    The patient denies current symptoms of depression.    Review of Systems: ROS  Past Medical History:  Past Medical History:  Diagnosis Date  . BV (bacterial vaginosis)   . Chlamydia 10/2016    Past Surgical History:  Past Surgical History:  Procedure Laterality Date  . KNEE SURGERY      Gynecologic History:  Patient's last menstrual period was 02/20/2019 (exact date). Contraception: coitus interruptus Last Pap: Results were: NIL 2018   Obstetric History: G0P0000  Family History:  Family History  Problem Relation Age of Onset  . Hypertension Mother   . Diabetes Maternal Grandmother   . Breast cancer Paternal Grandmother 24  . Diabetes Maternal Aunt   . Diabetes Maternal Uncle     Social History:  Social History   Socioeconomic History  . Marital status: Media planner    Spouse name: Not on file  . Number of children: Not on file  . Years of education: Not on file  . Highest education level: Not on file  Occupational History  . Not on file  Tobacco Use  . Smoking status: Former Games developer  . Smokeless tobacco: Never Used  Substance  and Sexual Activity  . Alcohol use: Not Currently  . Drug use: No  . Sexual activity: Yes    Birth control/protection: None  Other Topics Concern  . Not on file  Social History Narrative  . Not on file   Social Determinants of Health   Financial Resource Strain:   . Difficulty of Paying Living Expenses: Not on file  Food Insecurity:   . Worried About Programme researcher, broadcasting/film/video in the Last Year: Not on file  . Ran Out of Food in the Last Year: Not on file  Transportation Needs:   . Lack of Transportation (Medical): Not on file  . Lack of Transportation (Non-Medical): Not on file  Physical Activity:   . Days of Exercise per Week: Not on file  . Minutes of Exercise per Session: Not on file  Stress:   . Feeling of Stress : Not on file  Social Connections:   . Frequency of Communication with Friends and Family: Not on file  . Frequency of Social Gatherings with Friends and Family: Not on file  . Attends Religious Services: Not on file  . Active Member of Clubs or Organizations: Not on file  . Attends Banker Meetings: Not on file  . Marital Status: Not on file  Intimate Partner Violence:   . Fear of Current or Ex-Partner: Not on file  . Emotionally Abused: Not on file  . Physically Abused: Not on file  . Sexually  Abused: Not on file    Allergies:  No Known Allergies  Medications: Prior to Admission medications   Medication Sig Start Date End Date Taking? Authorizing Provider  Cholecalciferol (VITAMIN D3) 1.25 MG (50000 UT) TABS Take by mouth.   Yes [provider]  Multiple Vitamins-Minerals (MULTIVITAMIN WITH MINERALS) tablet Take 1 tablet by mouth daily.   Yes [provider]  sulfamethoxazole-trimethoprim (BACTRIM DS) 800-160 MG tablet Take 1 tablet by mouth 2 (two) times daily. 03/26/18  Yes Schuman, Christanna R, MD  valACYclovir (VALTREX) 1000 MG tablet Take 1 tablet (1,000 mg total) by mouth daily. 03/26/18  Yes Schuman, Christanna R, MD   clotrimazole (GYNE-LOTRIMIN 3) 2 % vaginal cream Place 1 Applicatorful vaginally at bedtime for 3 days. For 6 months use vaginal applicator twice a week 03/19/18 03/22/18  Homero Fellers, MD  ketoconazole (NIZORAL) 2 % cream  04/05/18   [provider]    Physical Exam Vitals: Blood pressure 110/78, height 6\' 1"  (1.854 m), weight 273 lb (123.8 kg), last menstrual period 02/20/2019.  General: NAD HEENT: normocephalic, anicteric Thyroid: no enlargement, no palpable nodules Pulmonary: No increased work of breathing, CTAB Cardiovascular: RRR, distal pulses 2+ Breast: Breast symmetrical, no tenderness, no palpable nodules or masses, no skin or nipple retraction present, no nipple discharge.  No axillary or supraclavicular lymphadenopathy. Abdomen: NABS, soft, non-tender, non-distended.  Umbilicus without lesions.  No hepatomegaly, splenomegaly or masses palpable. No evidence of hernia  Genitourinary:  External: Normal external female genitalia.  Normal urethral meatus, normal Bartholin's and Skene's glands.    Vagina: Normal vaginal mucosa, no evidence of prolapse.    Cervix: Grossly normal in appearance, no bleeding  Uterus: Non-enlarged, mobile, normal contour.  No CMT  Adnexa: ovaries non-enlarged, no adnexal masses  Rectal: deferred  Lymphatic: no evidence of inguinal lymphadenopathy Extremities: no edema, erythema, or tenderness Neurologic: Grossly intact Psychiatric: mood appropriate, affect full  Female chaperone present for pelvic and breast  portions of the physical exam    Assessment: 29 y.o. G0P0000 routine annual exam  Plan: Problem List Items Addressed This Visit    None    Visit Diagnoses    Healthcare maintenance    -  Primary   Screening for cervical cancer       Relevant Orders   Cytology - PAP   Chronic vaginitis       Yeast infection of the vagina          2) STI screening  was offered and declined  2)  ASCCP guidelines and rational  discussed.  Patient opts for every 3 years screening interval  3) Contraception - the patient is currently using  coitus interruptus.  She is happy with her current form of contraception and plans to continue  4) Routine healthcare maintenance including cholesterol, diabetes screening discussed up to date  5) Yeast vaginitis, hx of chronic infections. Controlled with fluconazole oral and topical therapy.  No symptoms but discharge and wet prep suggestive of infection today. Will send MDL today.   6) Return in about 1 year (around 03/20/2020) for annual.  Adrian Prows MD [facility] 03/21/2019 4:21 PM

## 2019-04-04 DIAGNOSIS — Q666 Other congenital valgus deformities of feet: Secondary | ICD-10-CM | POA: Diagnosis not present

## 2019-04-04 DIAGNOSIS — M76822 Posterior tibial tendinitis, left leg: Secondary | ICD-10-CM | POA: Diagnosis not present

## 2019-04-04 DIAGNOSIS — M76821 Posterior tibial tendinitis, right leg: Secondary | ICD-10-CM | POA: Diagnosis not present

## 2019-04-04 DIAGNOSIS — M722 Plantar fascial fibromatosis: Secondary | ICD-10-CM | POA: Diagnosis not present

## 2019-05-31 ENCOUNTER — Ambulatory Visit: Payer: BC Managed Care – PPO | Admitting: Obstetrics and Gynecology

## 2019-06-01 ENCOUNTER — Ambulatory Visit: Payer: BC Managed Care – PPO | Admitting: Obstetrics and Gynecology

## 2019-06-01 DIAGNOSIS — R350 Frequency of micturition: Secondary | ICD-10-CM | POA: Diagnosis not present

## 2019-06-01 NOTE — Progress Notes (Signed)
Patient, No Pcp Per   Chief Complaint  Patient presents with  . Urinary Tract Infection    burning and frequency urinating, strong odor since last Wed  . Vaginal Discharge    itching randomly, no odor on/off since Feb    HPI:      [name] is a 29 y.o. G0P0000 whose LMP was Patient's last menstrual period was 05/30/2019 (exact date)., presents today for UTi sx of dysuria, frequency, urine odor, hematuria, mild LBP for a wk. Hx of E Coli on C&S 2/20.   Also having vaginal d/c with itching, no odor, chronically off and on. Sometimes has cracks in vag skin that lasts a couple days. Had annual 2/21 and Dr. Gardiner Rhyme diagnosed yeast on wet prep but pt wasn't having sx at that time, so didn't tx. She uses dove sens skin soap, dryer sheets, wears pantyliners all day due to d/c. Not using condoms, taking probiotics. Has done wkly diflucan in past.  She is sex active, no new partners.   Past Medical History:  Diagnosis Date  . BV (bacterial vaginosis)   . Chlamydia 10/2016  . HSV-2 seropositive     Past Surgical History:  Procedure Laterality Date  . KNEE SURGERY      Family History  Problem Relation Age of Onset  . Hypertension Mother   . Diabetes Maternal Grandmother   . Breast cancer Maternal Grandmother 78  . Breast cancer Paternal Grandmother 27  . Diabetes Maternal Aunt   . Diabetes Maternal Uncle     Social History   Socioeconomic History  . Marital status: Soil scientist    Spouse name: Not on file  . Number of children: Not on file  . Years of education: Not on file  . Highest education level: Not on file  Occupational History  . Not on file  Tobacco Use  . Smoking status: Former Research scientist (life sciences)  . Smokeless tobacco: Never Used  Substance and Sexual Activity  . Alcohol use: Not Currently  . Drug use: No  . Sexual activity: Yes    Birth control/protection: None  Other Topics Concern  . Not on file  Social History Narrative  . Not on file   Social  Determinants of Health   Financial Resource Strain:   . Difficulty of Paying Living Expenses:   Food Insecurity:   . Worried About Charity fundraiser in the Last Year:   . Arboriculturist in the Last Year:   Transportation Needs:   . Film/video editor (Medical):   Marland Kitchen Lack of Transportation (Non-Medical):   Physical Activity:   . Days of Exercise per Week:   . Minutes of Exercise per Session:   Stress:   . Feeling of Stress :   Social Connections:   . Frequency of Communication with Friends and Family:   . Frequency of Social Gatherings with Friends and Family:   . Attends Religious Services:   . Active Member of Clubs or Organizations:   . Attends Archivist Meetings:   Marland Kitchen Marital Status:   Intimate Partner Violence:   . Fear of Current or Ex-Partner:   . Emotionally Abused:   Marland Kitchen Physically Abused:   . Sexually Abused:     Outpatient Medications Prior to Visit  Medication Sig Dispense Refill  . Cholecalciferol (VITAMIN D3) 1.25 MG (50000 UT) TABS Take by mouth.    . valACYclovir (VALTREX) 1000 MG tablet Take 1 tablet (1,000 mg total) by  mouth daily. (Patient not taking: Reported on 06/02/2019) 30 tablet 12  . clotrimazole (GYNE-LOTRIMIN 3) 2 % vaginal cream Place 1 Applicatorful vaginally at bedtime for 3 days. For 6 months use vaginal applicator twice a week 21 g 11  . ketoconazole (NIZORAL) 2 % cream     . Multiple Vitamins-Minerals (MULTIVITAMIN WITH MINERALS) tablet Take 1 tablet by mouth daily.    Marland Kitchen sulfamethoxazole-trimethoprim (BACTRIM DS) 800-160 MG tablet Take 1 tablet by mouth 2 (two) times daily. 6 tablet 0   No facility-administered medications prior to visit.      ROS:  Review of Systems  Constitutional: Negative for fever.  Gastrointestinal: Negative for blood in stool, constipation, diarrhea, nausea and vomiting.  Genitourinary: Positive for dysuria, frequency and vaginal discharge. Negative for dyspareunia, flank pain, hematuria, urgency,  vaginal bleeding and vaginal pain.  Musculoskeletal: Positive for back pain.  Skin: Negative for rash.  BREAST: No symptoms   OBJECTIVE:   Vitals:  BP 120/80   Ht 6\' 1"  (1.854 m)   Wt 255 lb (115.7 kg)   LMP 05/30/2019 (Exact Date)   BMI 33.64 kg/m   Physical Exam Vitals reviewed.  Constitutional:      Appearance: She is well-developed.  Pulmonary:     Effort: Pulmonary effort is normal.  Genitourinary:    General: Normal vulva.     Pubic Area: No rash.      Labia:        Right: No rash, tenderness or lesion.        Left: No rash, tenderness or lesion.      Vagina: Normal. No vaginal discharge, erythema or tenderness.     Cervix: Normal.     Uterus: Normal. Not enlarged and not tender.      Adnexa: Right adnexa normal and left adnexa normal.       Right: No mass or tenderness.         Left: No mass or tenderness.       Comments: DARK VAG D/C ON EXAM; NEG EXT VAG EXAM FOR YEAST/ERYTHEMA/FISSURES Musculoskeletal:        General: Normal range of motion.     Cervical back: Normal range of motion.  Skin:    General: Skin is warm and dry.  Neurological:     General: No focal deficit present.     Mental Status: She is alert and oriented to person, place, and time.  Psychiatric:        Mood and Affect: Mood normal.        Behavior: Behavior normal.        Thought Content: Thought content normal.        Judgment: Judgment normal.     Results: Results for orders placed or performed in visit on 06/02/19 (from the past 24 hour(s))  POCT Urinalysis Dipstick     Status: Abnormal   Collection Time: 06/02/19 12:13 PM  Result Value Ref Range   Color, UA yellow    Clarity, UA cloudy    Glucose, UA Negative Negative   Bilirubin, UA neg    Ketones, UA neg    Spec Grav, UA 1.020 1.010 - 1.025   Blood, UA mod    pH, UA 6.0 5.0 - 8.0   Protein, UA Negative Negative   Urobilinogen, UA     Nitrite, UA neg    Leukocytes, UA Small (1+) (A) Negative   Appearance     Odor     POCT Wet Prep with KOH  Status: Normal   Collection Time: 06/02/19 12:13 PM  Result Value Ref Range   Trichomonas, UA Negative    Clue Cells Wet Prep HPF POC neg    Epithelial Wet Prep HPF POC     Yeast Wet Prep HPF POC neg    Bacteria Wet Prep HPF POC     RBC Wet Prep HPF POC     WBC Wet Prep HPF POC     KOH Prep POC Negative Negative   PT WITH SCANT AMT DARK D/C ON VAG EXAM BUT PROB NOT ENOUGH FOR MOD RBCS ON UA  Assessment/Plan: Acute cystitis with hematuria - Plan: nitrofurantoin, macrocrystal-monohydrate, (MACROBID) 100 MG capsule, POCT Urinalysis Dipstick, Urine Culture; Pos sx and UA. Rx macrobid. Check C&S. F/u prn.  Yeast infection - Plan: fluconazole (DIFLUCAN) 150 MG tablet, POCT Wet Prep with KOH; chronic sx. Pos sx, neg wet prep. Rx diflucan, especially since doing abx for UTI. Line dry underwear, d/c pantyliners that are trapping moisture vag. F/u prn.    Meds ordered this encounter  Medications  . fluconazole (DIFLUCAN) 150 MG tablet    Sig: Take 1 tablet (150 mg total) by mouth once for 1 dose. Can take additional dose three days later if symptoms persist    Dispense:  2 tablet    Refill:  0    Order Specific Question:   Supervising Provider    Answer:   Nadara Mustard [[id number]]  . nitrofurantoin, macrocrystal-monohydrate, (MACROBID) 100 MG capsule    Sig: Take 1 capsule (100 mg total) by mouth 2 (two) times daily for 5 days.    Dispense:  10 capsule    Refill:  0    Order Specific Question:   Supervising Provider    Answer:   Nadara Mustard [[id number]]      Return if symptoms worsen or fail to improve.  Zakari Couchman B. Derita Michelsen, PA-C 06/02/2019 12:16 PM

## 2019-06-02 ENCOUNTER — Encounter: Payer: Self-pay | Admitting: Obstetrics and Gynecology

## 2019-06-02 ENCOUNTER — Ambulatory Visit (INDEPENDENT_AMBULATORY_CARE_PROVIDER_SITE_OTHER): Payer: BC Managed Care – PPO | Admitting: Obstetrics and Gynecology

## 2019-06-02 ENCOUNTER — Other Ambulatory Visit: Payer: Self-pay

## 2019-06-02 VITALS — BP 120/80 | Ht 73.0 in | Wt 255.0 lb

## 2019-06-02 DIAGNOSIS — N3001 Acute cystitis with hematuria: Secondary | ICD-10-CM | POA: Diagnosis not present

## 2019-06-02 DIAGNOSIS — B379 Candidiasis, unspecified: Secondary | ICD-10-CM | POA: Diagnosis not present

## 2019-06-02 LAB — POCT URINALYSIS DIPSTICK
Bilirubin, UA: NEGATIVE
Glucose, UA: NEGATIVE
Ketones, UA: NEGATIVE
Nitrite, UA: NEGATIVE
Protein, UA: NEGATIVE
Spec Grav, UA: 1.02 (ref 1.010–1.025)
pH, UA: 6 (ref 5.0–8.0)

## 2019-06-02 LAB — POCT WET PREP WITH KOH
Clue Cells Wet Prep HPF POC: NEGATIVE
KOH Prep POC: NEGATIVE
Trichomonas, UA: NEGATIVE
Yeast Wet Prep HPF POC: NEGATIVE

## 2019-06-02 MED ORDER — FLUCONAZOLE 150 MG PO TABS: 150.0000 mg | ORAL_TABLET | Freq: Once | ORAL | 0 refills | Status: DC

## 2019-06-02 MED ORDER — NITROFURANTOIN MONOHYD MACRO 100 MG PO CAPS: 100.0000 mg | ORAL_CAPSULE | Freq: Two times a day (BID) | ORAL | 0 refills | Status: AC

## 2019-06-02 NOTE — Patient Instructions (Signed)
I value your feedback and entrusting us with your care. If you get a [facility] patient survey, I would appreciate you taking the time to let us know about your experience today. Thank you!  As of January 06, 2019, your lab results will be released to your MyChart immediately, before I even have a chance to see them. Please give me time to review them and contact you if there are any abnormalities. Thank you for your patience.  

## 2019-06-05 LAB — URINE CULTURE

## 2019-08-18 ENCOUNTER — Encounter: Payer: Self-pay | Admitting: Obstetrics and Gynecology

## 2019-08-19 ENCOUNTER — Telehealth: Payer: Self-pay

## 2019-08-19 ENCOUNTER — Other Ambulatory Visit: Payer: Self-pay | Admitting: Obstetrics and Gynecology

## 2019-08-19 DIAGNOSIS — B379 Candidiasis, unspecified: Secondary | ICD-10-CM

## 2019-08-19 MED ORDER — FLUCONAZOLE 150 MG PO TABS: 150.0000 mg | ORAL_TABLET | Freq: Once | ORAL | 0 refills | Status: AC

## 2019-08-19 NOTE — Progress Notes (Signed)
Rx diflucan for yeast vag sx.  

## 2019-08-31 DIAGNOSIS — S8012XA Contusion of left lower leg, initial encounter: Secondary | ICD-10-CM | POA: Diagnosis not present

## 2019-08-31 DIAGNOSIS — R0781 Pleurodynia: Secondary | ICD-10-CM | POA: Diagnosis not present

## 2019-09-01 ENCOUNTER — Other Ambulatory Visit: Payer: Self-pay

## 2019-09-01 ENCOUNTER — Ambulatory Visit
Admission: EM | Admit: 2019-09-01 | Discharge: 2019-09-01 | Disposition: A | Discharge status: Home / Self Care | Payer: BC Managed Care – PPO | Attending: Family Medicine | Admitting: Family Medicine

## 2019-09-01 DIAGNOSIS — M546 Pain in thoracic spine: Secondary | ICD-10-CM | POA: Diagnosis not present
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
## 2019-09-01 NOTE — Discharge Instructions (Signed)
Medication as previously prescribed.  Okay to return to work.
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
## 2020-03-19 ENCOUNTER — Ambulatory Visit
Admission: EM | Admit: 2020-03-19 | Discharge: 2020-03-19 | Disposition: A | Discharge status: Home / Self Care | Payer: BC Managed Care – PPO | Attending: Family Medicine | Admitting: Family Medicine

## 2020-03-19 ENCOUNTER — Other Ambulatory Visit: Payer: Self-pay

## 2020-03-19 DIAGNOSIS — J0100 Acute maxillary sinusitis, unspecified: Secondary | ICD-10-CM

## 2020-03-19 MED ORDER — AMOXICILLIN-POT CLAVULANATE 875-125 MG PO TABS: 1.0000 | ORAL_TABLET | Freq: Two times a day (BID) | ORAL | 0 refills | Status: DC

## 2020-03-19 MED ORDER — HYDROCOD POLST-CPM POLST ER 10-8 MG/5ML PO SUER: 5.0000 mL | Freq: Every evening | ORAL | 0 refills | Status: DC | PRN

## 2020-03-19 NOTE — ED Triage Notes (Signed)
Pt is present to day with a cough, sore throat, HA, and nasal congestion. Pt states that her sx started last Tuesday. pt states that she tried OTC medication and nothing has helped. Pt states that she was tested for covid recently and tested negative

## 2020-03-19 NOTE — ED Provider Notes (Signed)
[facility]    CSN: [id number] Arrival date & time: 03/19/20  1326      History   Chief Complaint Chief Complaint  Patient presents with  . Cough  . Sore Throat   HPI  30 year old female presents with the above complaints.  Patient reports that she has been sick for the past week.  She reports sinus pressure and congestion.  She has postnasal drip as well and has a dry cough.  Cough is worse at night.  Preventing her from sleeping.  She has tried over-the-counter medication and has not had any improvement.  She has recently been tested for Covid and has been negative.  She states that when she normally gets a cold she recovers within a few days and has not done so.  She is concerned given the lack of improvement.  She states that some coworkers have told her that she likely has "bronchitis".  No fever.  No other associated symptoms.  No other complaints.  Past Medical History:  Diagnosis Date  . BV (bacterial vaginosis)   . Chlamydia 10/2016  . HSV-2 seropositive     Patient Active Problem List   Diagnosis Date Noted  . Chlamydia 11/24/2016    Past Surgical History:  Procedure Laterality Date  . KNEE SURGERY      OB History    Gravida  0   Para  0   Term  0   Preterm  0   AB  0   Living  0     SAB  0   IAB  0   Ectopic  0   Multiple  0   Live Births  0            Home Medications    Prior to Admission medications   Medication Sig Start Date End Date Taking? Authorizing Provider  amoxicillin-clavulanate (AUGMENTIN) 875-125 MG tablet Take 1 tablet by mouth 2 (two) times daily. 03/19/20  Yes Sydell Prowell G, DO  chlorpheniramine-HYDROcodone (TUSSIONEX PENNKINETIC ER) 10-8 MG/5ML SUER Take 5 mLs by mouth at bedtime as needed. 03/19/20  Yes Shonta Phillis G, DO  Cholecalciferol (VITAMIN D3) 1.25 MG (50000 UT) TABS Take by mouth.    [provider]  valACYclovir (VALTREX) 1000 MG tablet Take 1 tablet (1,000 mg total) by mouth  daily. Patient not taking: Reported on 06/02/2019 03/26/18   Natale Milch, MD    Family History Family History  Problem Relation Age of Onset  . Hypertension Mother   . Diabetes Maternal Grandmother   . Breast cancer Maternal Grandmother 61  . Breast cancer Paternal Grandmother 62  . Diabetes Maternal Aunt   . Diabetes Maternal Uncle     Social History Social History   Tobacco Use  . Smoking status: Former Games developer  . Smokeless tobacco: Never Used  Vaping Use  . Vaping Use: Every day  Substance Use Topics  . Alcohol use: Not Currently  . Drug use: No     Allergies   Patient has no known allergies.   Review of Systems Review of Systems Per HPI  Physical Exam Triage Vital Signs ED Triage Vitals  Enc Vitals Group     BP 03/19/20 1350 128/87     Pulse Rate 03/19/20 1350 90     Resp 03/19/20 1350 17     Temp 03/19/20 1350 98.2 F (36.8 C)     Temp Source 03/19/20 1350 Oral     SpO2 03/19/20 1350 98 %  Weight --      Height --      Head Circumference --      Peak Flow --      Pain Score 03/19/20 1348 0     Pain Loc --      Pain Edu? --      Excl. in GC? --    Updated Vital Signs BP 128/87 (BP Location: Right Arm)   Pulse 90   Temp 98.2 F (36.8 C) (Oral)   Resp 17   LMP 02/13/2020 (Approximate)   SpO2 98%   Visual Acuity Right Eye Distance:   Left Eye Distance:   Bilateral Distance:    Right Eye Near:   Left Eye Near:    Bilateral Near:     Physical Exam Vitals and nursing note reviewed.  Constitutional:      General: She is not in acute distress.    Appearance: She is not ill-appearing.  HENT:     Head: Normocephalic and atraumatic.     Right Ear: Tympanic membrane normal.     Left Ear: Tympanic membrane normal.     Nose: Congestion present.     Mouth/Throat:     Pharynx: Oropharynx is clear. No oropharyngeal exudate or posterior oropharyngeal erythema.  Eyes:     General:        Right eye: No discharge.        Left eye: No  discharge.     Conjunctiva/sclera: Conjunctivae normal.  Cardiovascular:     Rate and Rhythm: Normal rate and regular rhythm.     Heart sounds: No murmur heard.   Pulmonary:     Effort: Pulmonary effort is normal.     Breath sounds: Normal breath sounds. No wheezing, rhonchi or rales.  Neurological:     Mental Status: She is alert.  Psychiatric:        Mood and Affect: Mood normal.        Behavior: Behavior normal.    UC Treatments / Results  Labs (all labs ordered are listed, but only abnormal results are displayed) Labs Reviewed - No data to display  EKG   Radiology No results found.  Procedures Procedures (including critical care time)  Medications Ordered in UC Medications - No data to display  Initial Impression / Assessment and Plan / UC Course  I have reviewed the triage vital signs and the nursing notes.  Pertinent labs & imaging results that were available during my care of the patient were reviewed by me and considered in my medical decision making (see chart for details).    30 year old female presents with sinusitis.  Associated to cough from postnasal drip.  Treating with Augmentin and Tussionex.  Final Clinical Impressions(s) / UC Diagnoses   Final diagnoses:  Acute maxillary sinusitis, recurrence not specified   Discharge Instructions   None    ED Prescriptions    Medication Sig Dispense Auth. Provider   amoxicillin-clavulanate (AUGMENTIN) 875-125 MG tablet Take 1 tablet by mouth 2 (two) times daily. 20 tablet [place], [place] G, DO   chlorpheniramine-HYDROcodone (TUSSIONEX PENNKINETIC ER) 10-8 MG/5ML SUER Take 5 mLs by mouth at bedtime as needed. 60 mL Tommie Sams, DO     PDMP not reviewed this encounter.   Tommie Sams, [place] 03/19/20 1501

## 2020-03-29 ENCOUNTER — Ambulatory Visit: Payer: BC Managed Care – PPO | Admitting: Advanced Practice Midwife

## 2020-04-02 ENCOUNTER — Encounter: Payer: Self-pay | Admitting: Obstetrics and Gynecology

## 2020-04-02 ENCOUNTER — Ambulatory Visit (INDEPENDENT_AMBULATORY_CARE_PROVIDER_SITE_OTHER): Payer: 59 | Admitting: Obstetrics and Gynecology

## 2020-04-02 ENCOUNTER — Other Ambulatory Visit: Payer: Self-pay

## 2020-04-02 ENCOUNTER — Other Ambulatory Visit (HOSPITAL_COMMUNITY)
Admission: RE | Admit: 2020-04-02 | Discharge: 2020-04-02 | Disposition: A | Discharge status: Home / Self Care | Payer: 59 | Source: Ambulatory Visit | Attending: Obstetrics and Gynecology | Admitting: Obstetrics and Gynecology

## 2020-04-02 VITALS — BP 120/80 | Ht 73.0 in | Wt 306.0 lb

## 2020-04-02 DIAGNOSIS — Z124 Encounter for screening for malignant neoplasm of cervix: Secondary | ICD-10-CM | POA: Insufficient documentation

## 2020-04-02 DIAGNOSIS — Z6841 Body Mass Index (BMI) 40.0 and over, adult: Secondary | ICD-10-CM

## 2020-04-02 DIAGNOSIS — B3731 Acute candidiasis of vulva and vagina: Secondary | ICD-10-CM

## 2020-04-02 DIAGNOSIS — B373 Candidiasis of vulva and vagina: Secondary | ICD-10-CM | POA: Diagnosis not present

## 2020-04-02 DIAGNOSIS — N761 Subacute and chronic vaginitis: Secondary | ICD-10-CM | POA: Diagnosis not present

## 2020-04-02 DIAGNOSIS — Z7185 Encounter for immunization safety counseling: Secondary | ICD-10-CM

## 2020-04-02 DIAGNOSIS — Z01411 Encounter for gynecological examination (general) (routine) with abnormal findings: Secondary | ICD-10-CM

## 2020-04-02 DIAGNOSIS — Z131 Encounter for screening for diabetes mellitus: Secondary | ICD-10-CM

## 2020-04-02 DIAGNOSIS — Z0000 Encounter for general adult medical examination without abnormal findings: Secondary | ICD-10-CM

## 2020-04-02 DIAGNOSIS — Z1239 Encounter for other screening for malignant neoplasm of breast: Secondary | ICD-10-CM

## 2020-04-02 MED ORDER — FLUCONAZOLE 150 MG PO TABS: 150.0000 mg | ORAL_TABLET | Freq: Once | ORAL | 0 refills | Status: AC

## 2020-04-02 MED ORDER — PHENTERMINE HCL 37.5 MG PO TABS: 37.5000 mg | ORAL_TABLET | Freq: Every day | ORAL | 0 refills | Status: DC

## 2020-04-02 NOTE — Progress Notes (Signed)
Gynecology Annual Exam   PCP: Patient, No Pcp Per  Chief Complaint:  Chief Complaint  Patient presents with  . Annual Exam    Vaginal itching     History of Present Illness: Patient is a 30 y.o. G0P0000 presents for annual exam. The patient reports persistent symptoms of chronic vaginitis. She states she has vulvar itching and thick white discharge which has been present for "10 years." The patient has been seen by and treated at this practice for vaginal yeast for since 2018. Additionally, the patient expresses concern about her current weight and would like information on assistance with weight loss. The patient states that during the past year (2021) she was on the Keto diet for 3 months and had a total of 50 lbs of weight loss. She reports that after Keto she quickly regained all the weight in 2 months.   LMP: Patient's last menstrual period was 03/29/2020. Average Interval: regular, 28 days Duration of flow: 5 days Heavy Menses: yes Clots: no Intermenstrual Bleeding: no Postcoital Bleeding: no Dysmenorrhea: no  The patient is sexually active. She currently uses nothing for contraception. She denies dyspareunia.  There is no notable family history of breast or ovarian cancer in her family.  The patient wears seatbelts: yes.   The patient has regular exercise: she states she was previously excercising regularly while completing the Keto diet - she states she has a room set up at her home for weight lift and excercise but has not utilized it recently.    PQH-9: 12  [no to question #9] GAD-7: 9 Patient states she is coping well currently, declines further intervention.  Review of Systems: Review of Systems  Constitutional: Positive for malaise/fatigue.  HENT: Negative.   Eyes: Negative.   Respiratory: Negative.   Cardiovascular: Negative.   Gastrointestinal: Negative.   Genitourinary:       Chronic/persistent vaginal itching White, thick vaginal discharge   Musculoskeletal: Negative.   Skin: Negative.   Neurological: Negative.   Endo/Heme/Allergies: Negative.   Psychiatric/Behavioral: Negative.     Past Medical History:  There are no problems to display for this patient.   Past Surgical History:  Past Surgical History:  Procedure Laterality Date  . KNEE SURGERY      Gynecologic History:  Patient's last menstrual period was 03/29/2020. Contraception: none Last Pap: 02/28/16 Results were: NILM   Obstetric History: G0P0000  Family History:  Family History  Problem Relation Age of Onset  . Hypertension Mother   . Diabetes Maternal Grandmother   . Breast cancer Maternal Grandmother 94  . Breast cancer Paternal Grandmother 60  . Diabetes Maternal Aunt   . Diabetes Maternal Uncle     Social History:  Social History   Socioeconomic History  . Marital status: Media planner    Spouse name: Not on file  . Number of children: Not on file  . Years of education: Not on file  . Highest education level: Not on file  Occupational History  . Not on file  Tobacco Use  . Smoking status: Former Games developer  . Smokeless tobacco: Never Used  Vaping Use  . Vaping Use: Every day  Substance and Sexual Activity  . Alcohol use: Not Currently  . Drug use: No  . Sexual activity: Yes    Birth control/protection: None  Other Topics Concern  . Not on file  Social History Narrative  . Not on file   Social Determinants of Health   Financial Resource Strain: Not  on file  Food Insecurity: Not on file  Transportation Needs: Not on file  Physical Activity: Not on file  Stress: Not on file  Social Connections: Not on file  Intimate Partner Violence: Not on file    Allergies:  No Known Allergies  Medications: Prior to Admission medications   Medication Sig Start Date End Date Taking? Authorizing Provider  phentermine (ADIPEX-P) 37.5 MG tablet Take 1 tablet (37.5 mg total) by mouth daily before breakfast. 04/02/20  Yes Veal, Katelyn, CNM   amoxicillin-clavulanate (AUGMENTIN) 875-125 MG tablet Take 1 tablet by mouth 2 (two) times daily. Patient not taking: Reported on 04/02/2020 03/19/20   Tommie Sams, DO  chlorpheniramine-HYDROcodone [facility] ER) 10-8 MG/5ML SUER Take 5 mLs by mouth at bedtime as needed. Patient not taking: Reported on 04/02/2020 03/19/20   Tommie Sams, DO  Cholecalciferol (VITAMIN D3) 1.25 MG (50000 UT) TABS Take by mouth. Patient not taking: Reported on 04/02/2020    [provider]  valACYclovir (VALTREX) 1000 MG tablet Take 1 tablet (1,000 mg total) by mouth daily. Patient not taking: No sig reported 03/26/18   Natale Milch, MD    Physical Exam Vitals: Blood pressure 120/80, height 6\' 1"  (1.854 m), weight (!) 306 lb (138.8 kg), last menstrual period 03/29/2020.  General: NAD HEENT: normocephalic, anicteric Thyroid: no enlargement, no palpable nodules Pulmonary: No increased work of breathing, CTAB Cardiovascular: RRR, distal pulses 2+ Breast: Breast symmetrical, no tenderness, no palpable nodules or masses, no skin or nipple retraction present, no nipple discharge.  No axillary or supraclavicular lymphadenopathy. Abdomen: NABS, soft, non-tender, non-distended.  Umbilicus without lesions.  No hepatomegaly, splenomegaly or masses palpable. No evidence of hernia  Genitourinary:  External: Normal external female genitalia.  Normal urethral meatus, normal Bartholin's and Skene's glands.    Vagina: Normal vaginal mucosa, no evidence of prolapse. Moderate amount of brownish/red blood in vaginal vault.  Cervix: Grossly normal in appearance  Bimanual exam deferred   Lymphatic: no evidence of inguinal lymphadenopathy Extremities: no edema, erythema, or tenderness Neurologic: Grossly intact Psychiatric: mood appropriate, affect full  Wet Prep: PH: 4.5 Clue Cells: Negative Fungal elements: Positive Trichomonas: Negative   Assessment: 30 y.o. G0P0000 routine annual exam with  chronic vaginitis, class III obesity without comorbidity  Plan: Problem List Items Addressed This Visit   None   Visit Diagnoses    Chronic vaginitis    -  Primary   Relevant Orders   NuSwab VG, Candida 6sp   Candida vaginitis       Relevant Medications   fluconazole (DIFLUCAN) 150 MG tablet   Other Relevant Orders   NuSwab VG, Candida 6sp   Screening for cervical cancer       Relevant Orders   Cytology - PAP   Immunization counseling       Routine health maintenance       Encounter for gynecological examination with abnormal finding       Screening breast examination       Screening for diabetes mellitus       Relevant Orders   Hemoglobin A1c   BMI 40.0-44.9, adult (HCC)       Relevant Medications   phentermine (ADIPEX-P) 37.5 MG tablet   Class 3 severe obesity due to excess calories without serious comorbidity with body mass index (BMI) of 40.0 to 44.9 in adult [facility])       Relevant Medications   phentermine (ADIPEX-P) 37.5 MG tablet      1) STI screening  was offered and declined  2)  ASCCP guidelines and rational discussed.  Patient opts for every 3 years screening interval  3) Contraception - the patient is currently using  nothing.  She is happy with her current plan for reporductive health  4) Routine healthcare maintenance including cholesterol, diabetes screening discussed patient declined lipid screening today, requests diabetes screening  5) Wet mount + yeast. Yeast culture collected due to history of recurrent/persistent infections. Diflucan rx'd today, will f/u pending culture.  6) Patient education given regarding appropriate lifestyle changes for weight loss including: regular physical activity, healthy coping strategies, caloric restriction and healthy eating patterns.   7)  Patient will be started on weight loss medication. The risks and benefits and side effects of medication, such as Adipex (Phenteramine) ,  Tenuate (Diethylproprion), Belviq  (lorcarsin), Contrave (buproprion/naltrexone), Qsymia (phentermine/topiramate), and Saxenda (liraglutide) is discussed. The pros and cons of suppressing appetite and boosting metabolism is discussed. Risks of tolerence and addiction is discussed for selected agents discussed. Use of medicine will ne short term, such as 3-4 months at a time followed by a period of time off of the medicine to avoid these risks and side effects for Adipex, Qsymia, and Tenuate discussed. Pt to call with any negative side effects and agrees to keep follow up appts.  8) Patient to take medication, with the benefits of appetite suppression and metabolism boost d/w pt, along with the side effects and risk factors of long term use that will be avoided with our use of short bursts of therapy. Rx provided.   9) Return in about 4 weeks (around 04/30/2020), or if symptoms worsen or fail to improve, for Medication follow-up.   Zipporah Plants, CNM, MSN [facility], [facility] 04/02/2020, 12:12 PM

## 2020-04-03 LAB — HEMOGLOBIN A1C
Est. average glucose Bld gHb Est-mCnc: 103 mg/dL
Hgb A1c MFr Bld: 5.2 % (ref 4.8–5.6)

## 2020-04-03 LAB — CYTOLOGY - PAP
Comment: NEGATIVE
Diagnosis: NEGATIVE
High risk HPV: NEGATIVE

## 2020-04-05 LAB — NUSWAB VG, CANDIDA 6SP
C PARAPSILOSIS/TROPICALIS: NEGATIVE
Candida albicans, NAA: NEGATIVE
Candida glabrata, NAA: NEGATIVE
Candida krusei, NAA: NEGATIVE
Candida lusitaniae, NAA: NEGATIVE
Trich vag by NAA: NEGATIVE

## 2020-05-07 ENCOUNTER — Ambulatory Visit (INDEPENDENT_AMBULATORY_CARE_PROVIDER_SITE_OTHER): Payer: 59 | Admitting: Obstetrics and Gynecology

## 2020-05-07 ENCOUNTER — Other Ambulatory Visit: Payer: Self-pay

## 2020-05-07 ENCOUNTER — Encounter: Payer: Self-pay | Admitting: Obstetrics and Gynecology

## 2020-05-07 VITALS — BP 120/80 | Ht 73.0 in | Wt 300.0 lb

## 2020-05-07 DIAGNOSIS — R768 Other specified abnormal immunological findings in serum: Secondary | ICD-10-CM

## 2020-05-07 DIAGNOSIS — Z6839 Body mass index (BMI) 39.0-39.9, adult: Secondary | ICD-10-CM

## 2020-05-07 DIAGNOSIS — Z79899 Other long term (current) drug therapy: Secondary | ICD-10-CM

## 2020-05-07 MED ORDER — VALACYCLOVIR HCL 500 MG PO TABS: 500.0000 mg | ORAL_TABLET | Freq: Two times a day (BID) | ORAL | 6 refills | Status: AC

## 2020-05-07 NOTE — Progress Notes (Signed)
Patient ID: Jillian Garcia, female   DOB: 08/06/1990, 30 y.o.   MRN: [id number]  Reason for Visit: Follow-up (Weight loss)  Subjective:     HPI:  Jillian Garcia is a 30 y.o. female who presents to follow-up on phentermine rx for weight loss. Patient reports that she had good success with the medication for two weeks then developed symptoms of constipation. She states that in the first two weeks she had noted weight loss, reporting at that time she got to 289lbs. Patient had difficulty managing her symptom of constipation and subsequently stopped taking the medication for a week. The week she intended to restart it, she dropped her medication on the floor and decided to stop taking the prescription at that time. She reports additionally that following the first two weeks of success she stopped going to the gym regularly and fell back into her previously dietary habits. She now reports a desires to get back to working out and dieting. Patient is requesting a refill on valtrex for previous dx of HSV.   Past Medical History:  Diagnosis Date  . BV (bacterial vaginosis)   . Chlamydia 10/2016  . HSV-2 seropositive    Family History  Problem Relation Age of Onset  . Hypertension Mother   . Diabetes Maternal Grandmother   . Breast cancer Maternal Grandmother 53  . Breast cancer Paternal Grandmother 62  . Diabetes Maternal Aunt   . Diabetes Maternal Uncle    Past Surgical History:  Procedure Laterality Date  . KNEE SURGERY      Short Social History:  Social History   Tobacco Use  . Smoking status: Former Games developer  . Smokeless tobacco: Never Used  Substance Use Topics  . Alcohol use: Not Currently    No Known Allergies  Current Outpatient Medications  Medication Sig Dispense Refill  . Cholecalciferol (VITAMIN D3) 1.25 MG (50000 UT) TABS Take by mouth.    . phentermine (ADIPEX-P) 37.5 MG tablet Take 1 tablet (37.5 mg total) by mouth daily before breakfast. (Patient not taking:  Reported on 05/07/2020) 30 tablet 0   No current facility-administered medications for this visit.    Review of Systems  Constitutional:  Constitutional negative. HENT: HENT negative.  Eyes: Eyes negative.  Respiratory: Respiratory negative.  Cardiovascular: Cardiovascular negative.  GI:       constipation GU: Genitourinary negative. Musculoskeletal: Musculoskeletal negative.  Skin: Skin negative.  Neurological: Neurological negative. Hematologic: Hematologic/lymphatic negative.  Psychiatric: Psychiatric negative.        Objective:  Objective   Vitals:   05/07/20 0919  BP: 120/80  Weight: 300 lb (136.1 kg)  Height: 6\' 1"  (1.854 m)   Body mass index is 39.58 kg/m.  Physical Exam Constitutional:      Appearance: Normal appearance. She is obese.  HENT:     Head: Normocephalic.  Eyes:     Pupils: Pupils are equal, round, and reactive to light.  Pulmonary:     Effort: Pulmonary effort is normal.  Neurological:     General: No focal deficit present.     Mental Status: She is alert and oriented to person, place, and time.  Psychiatric:        Mood and Affect: Mood normal.        Behavior: Behavior normal.     Assessment/Plan:     30 yo, G0, for follow-up on medication for weight loss management. Plan made with patient to suspend therapy at this time. She will RTC once she reestablishes  her dietary changes and active lifestyle if she desires to additionally pursue medical management for weight loss.  Valtrex rx for hx of HSV  Problem List Items Addressed This Visit   None   Visit Diagnoses    BMI 39.0-39.9,adult    -  Primary   Follow-up encounter involving medication       HSV-2 seropositive       Relevant Medications   valACYclovir (VALTREX) 500 MG tablet        Zipporah Plants, CNM [facility], [facility] 05/07/2020 10:35 AM

## 2020-05-18 ENCOUNTER — Encounter: Payer: Self-pay | Admitting: Obstetrics and Gynecology

## 2020-07-02 ENCOUNTER — Other Ambulatory Visit: Payer: Self-pay

## 2020-11-12 ENCOUNTER — Ambulatory Visit
Admission: RE | Admit: 2020-11-12 | Discharge: 2020-11-12 | Disposition: A | Discharge status: Home / Self Care | Payer: BC Managed Care – PPO | Attending: Family Medicine | Admitting: Family Medicine

## 2020-11-12 ENCOUNTER — Ambulatory Visit
Admission: RE | Admit: 2020-11-12 | Discharge: 2020-11-12 | Disposition: A | Discharge status: Home / Self Care | Payer: BC Managed Care – PPO | Source: Ambulatory Visit | Attending: Family Medicine | Admitting: Family Medicine

## 2020-11-12 ENCOUNTER — Other Ambulatory Visit: Payer: Self-pay

## 2020-11-12 ENCOUNTER — Encounter: Payer: Self-pay | Admitting: Family Medicine

## 2020-11-12 ENCOUNTER — Ambulatory Visit (INDEPENDENT_AMBULATORY_CARE_PROVIDER_SITE_OTHER): Payer: BC Managed Care – PPO | Admitting: Family Medicine

## 2020-11-12 VITALS — BP 122/86 | HR 77 | Temp 98.3°F | Ht 73.0 in | Wt 310.0 lb

## 2020-11-12 DIAGNOSIS — M25572 Pain in left ankle and joints of left foot: Secondary | ICD-10-CM

## 2020-11-12 DIAGNOSIS — K2100 Gastro-esophageal reflux disease with esophagitis, without bleeding: Secondary | ICD-10-CM | POA: Diagnosis not present

## 2020-11-12 DIAGNOSIS — J302 Other seasonal allergic rhinitis: Secondary | ICD-10-CM

## 2020-11-12 DIAGNOSIS — G8929 Other chronic pain: Secondary | ICD-10-CM

## 2020-11-12 DIAGNOSIS — M25571 Pain in right ankle and joints of right foot: Secondary | ICD-10-CM

## 2020-11-12 DIAGNOSIS — K5904 Chronic idiopathic constipation: Secondary | ICD-10-CM | POA: Diagnosis not present

## 2020-11-12 MED ORDER — PANTOPRAZOLE SODIUM 40 MG PO TBEC: 40.0000 mg | DELAYED_RELEASE_TABLET | Freq: Every day | ORAL | 3 refills | Status: DC

## 2020-11-12 NOTE — Assessment & Plan Note (Signed)
Chronic bilateral anterolateral ankle pain in the setting of significant pes planus and BMI 40+. We will order dedicated x-rays for her ankles and start Pennsaid with sample, if beneficial, she can transition to OTC Voltaren until follow-up. I did discuss the non-surgical treatments for chronic ankle pain in the setting of pes planus, inclusive of NSAIDs, injections, physical therapy, and orthotics. She will consider these and we can discuss these further at her return in 4 weeks.

## 2020-11-12 NOTE — Assessment & Plan Note (Signed)
Chronic issue for which she has had superimposed sinus infections. Stated history raising concern for postnasal component. Examination with swollen nasal turbinates bilaterally, mild left anterior cervical chain nontender lymphadenopathy, but otherwise ENT and cardiopulmonary findings benign.  She is to start OTC intranasal fluticasone x 7 days then PRN, adjunct supportive measures discussed. Follow-up in 4 weeks time.

## 2020-11-12 NOTE — Progress Notes (Signed)
Primary Care / Sports Medicine Office Visit  Patient Information:  Patient ID: Jillian Garcia, female DOB: [birth year] Age: 30 y.o. MRN: [id number]   Jillian Garcia is a pleasant 30 y.o. female presenting with the following:  Chief Complaint  Patient presents with   New Patient (Initial Visit)   Establish Care   Sinus Problem    X1 year; concern more mucus production causing halitosis   Ankle Pain    Bilateral; x2 years; worse at the end of the day; taking Tylenol arthritis and has tried changing shoes with no relief; no imaging; history of seeing podiatry 1.5 years ago per patient; 1/10 pain in office    Review of Systems pertinent details above   Patient Active Problem List   Diagnosis Date Noted   Gastroesophageal reflux disease with esophagitis without hemorrhage 11/12/2020   Seasonal allergic rhinitis 11/12/2020   Chronic ankle pain, bilateral 11/12/2020   Chronic idiopathic constipation 11/12/2020   Morbid obesity (HCC) 11/12/2020   Past Medical History:  Diagnosis Date   Anxiety    BV (bacterial vaginosis)    Chlamydia 10/2016   Depression    Femoral fracture (HCC)    left   HSV-2 seropositive    Vitamin D deficiency    Outpatient Encounter Medications as of 11/12/2020  Medication Sig   Acetaminophen (TYLENOL ARTHRITIS PAIN PO) Take 1,300 mg by mouth 2 (two) times daily as needed.   pantoprazole (PROTONIX) 40 MG tablet Take 1 tablet (40 mg total) by mouth daily.   phentermine (ADIPEX-P) 37.5 MG tablet Take 1 tablet (37.5 mg total) by mouth daily before breakfast. (Patient not taking: Reported on 11/12/2020)   [DISCONTINUED] Cholecalciferol (VITAMIN D3) 1.25 MG (50000 UT) TABS Take by mouth.   No facility-administered encounter medications on file as of 11/12/2020.   Past Surgical History:  Procedure Laterality Date   KNEE SURGERY Left 2014    Vitals:   11/12/20 1105  BP: 122/86  Pulse: 77  Temp: 98.3 F (36.8 C)  SpO2: 97%   Vitals:   11/12/20  1105  Weight: (!) 310 lb (140.6 kg)  Height: 6\' 1"  (1.854 m)   Body mass index is 40.9 kg/m.  No results found.   Independent interpretation of notes and tests performed by another provider:   None  Procedures performed:   None  Pertinent History, Exam, Impression, and Recommendations:   Seasonal allergic rhinitis Chronic issue for which she has had superimposed sinus infections. Stated history raising concern for postnasal component. Examination with swollen nasal turbinates bilaterally, mild left anterior cervical chain nontender lymphadenopathy, but otherwise ENT and cardiopulmonary findings benign.  She is to start OTC intranasal fluticasone x 7 days then PRN, adjunct supportive measures discussed. Follow-up in 4 weeks time.  Gastroesophageal reflux disease with esophagitis without hemorrhage Stated history over epigastric pain, radiation superiorly, morning throat symptoms and increased secretions with halitosis. Examination of the abdomen is significant for epigastric tenderness and hypoactive bowel sounds, otherwise benign.  Concern for GERD and constipation, pantoprazole prescribed and OTC Miralax advised. She is to perform lifestyle changes with information proivded. Pending symptoms at return, can discuss continued pharmacotherapy versus lifestyle modifications alone if sufficient.   Chronic idiopathic constipation See additional assessment(s) for plan details.  Chronic ankle pain, bilateral Chronic bilateral anterolateral ankle pain in the setting of significant pes planus and BMI 40+. We will order dedicated x-rays for her ankles and start Pennsaid with sample, if beneficial, she can transition to OTC Voltaren  until follow-up. I did discuss the non-surgical treatments for chronic ankle pain in the setting of pes planus, inclusive of NSAIDs, injections, physical therapy, and orthotics. She will consider these and we can discuss these further at her return in 4 weeks.    Orders & Medications Meds ordered this encounter  Medications   pantoprazole (PROTONIX) 40 MG tablet    Sig: Take 1 tablet (40 mg total) by mouth daily.    Dispense:  30 tablet    Refill:  3   Orders Placed This Encounter  Procedures   DG Ankle Complete Right   DG Ankle Complete Left     Return in about 4 weeks (around 12/10/2020) for follow-up.     [name], MD   Primary Care Sports Medicine [facility]

## 2020-11-12 NOTE — Assessment & Plan Note (Signed)
See additional assessment(s) for plan details. 

## 2020-11-12 NOTE — Assessment & Plan Note (Signed)
Stated history over epigastric pain, radiation superiorly, morning throat symptoms and increased secretions with halitosis. Examination of the abdomen is significant for epigastric tenderness and hypoactive bowel sounds, otherwise benign.  Concern for GERD and constipation, pantoprazole prescribed and OTC Miralax advised. Jillian Garcia is to perform lifestyle changes with information proivded. Pending symptoms at return, can discuss continued pharmacotherapy versus lifestyle modifications alone if sufficient.

## 2020-11-12 NOTE — Patient Instructions (Signed)
-   Review information on reflux, rhinitis, and constipation - Obtain ankle x-rays - Start pantoprazole daily on an empty stomach until follow-up - Start OTC MiraLAX daily, can adjust dose frequency with a goal of daily smooth bowel movement without straining - Start OTC Flonase (fluticasone propionate) x7 days then daily as needed - Can trial OTC Voltaren gel (diclofenac 1%) on both ankles up to 4 times daily on an as-needed basis - Recommend follow-up with OB/GYN for medication refills, routine well woman visit - Return for follow-up in 4 weeks, contact us for questions over the interim

## 2020-11-20 ENCOUNTER — Encounter: Payer: Self-pay | Admitting: Obstetrics and Gynecology

## 2020-11-20 ENCOUNTER — Other Ambulatory Visit: Payer: Self-pay

## 2020-11-20 ENCOUNTER — Ambulatory Visit (INDEPENDENT_AMBULATORY_CARE_PROVIDER_SITE_OTHER): Payer: BC Managed Care – PPO | Admitting: Obstetrics and Gynecology

## 2020-11-20 VITALS — BP 150/90 | Ht 73.0 in | Wt 317.0 lb

## 2020-11-20 DIAGNOSIS — N898 Other specified noninflammatory disorders of vagina: Secondary | ICD-10-CM

## 2020-11-20 DIAGNOSIS — B3731 Acute candidiasis of vulva and vagina: Secondary | ICD-10-CM | POA: Diagnosis not present

## 2020-11-20 DIAGNOSIS — Z713 Dietary counseling and surveillance: Secondary | ICD-10-CM | POA: Diagnosis not present

## 2020-11-20 DIAGNOSIS — Z113 Encounter for screening for infections with a predominantly sexual mode of transmission: Secondary | ICD-10-CM | POA: Diagnosis not present

## 2020-11-20 MED ORDER — PHENTERMINE HCL 37.5 MG PO TABS: 37.5000 mg | ORAL_TABLET | Freq: Every day | ORAL | 1 refills | Status: DC

## 2020-11-20 MED ORDER — FLUCONAZOLE 150 MG PO TABS: ORAL_TABLET | ORAL | 0 refills | Status: DC

## 2020-11-20 NOTE — Progress Notes (Signed)
Jillian Banana, MD   Chief Complaint  Patient presents with   Vaginal Itching    Gets the itchiness every time when pt is on cycle x 1 year. Qs on her weight    HPI:      [name] is a 30 y.o. G0P0000 whose LMP was Patient's last menstrual period was 11/17/2020 (exact date)., presents today for vaginal itching. Sx are monthly before her period. Hx of recurrent yeast vag for many yrs, confirmed on cultures. Pt uses dove sens skin soap, cotton underwear, but wears pantyliners daily. Had positive HSV 1 IgG on 2/20 labs after partner diagnosed with type 2 HSV (with same partner). Pt's HSV 2 IgG at his dx was negative but hasn't been rechecked. Pt thinks recurrent vaginal itching monthly may be a herpes outbreak but denies sores, ulcers. Treats with OTC antifungal (athlete's foot) crm ext with sx relief after a few days. Hx of candida albicans on culture 3/22. Neg pap 3/22  Pt also would like to lose wt. Lost 50# with keto diet in past but gained it back when resumed normal diet. Did phentermine in past for a few wks with wt loss.   Past Medical History:  Diagnosis Date   Anxiety    BV (bacterial vaginosis)    Chlamydia 10/2016   Depression    Femoral fracture (HCC)    left   HSV-2 seropositive    Vitamin D deficiency     Past Surgical History:  Procedure Laterality Date   KNEE SURGERY Left 2014    Family History  Problem Relation Age of Onset   Hypertension Mother    Hypertension Father    Diabetes Maternal Aunt    Diabetes Maternal Uncle    Diabetes Maternal Grandmother    Breast cancer Maternal Grandmother 43   Breast cancer Paternal Grandmother 18    Social History   Socioeconomic History   Marital status: Single    Spouse name: Not on file   Number of children: 0   Years of education: 12+   Highest education level: Some college, no degree  Occupational History   Not on file  Tobacco Use   Smoking status: Former    Packs/day: 0.50    Years:  10.00    Pack years: 5.00    Types: Cigarettes    Quit date: 2019    Years since quitting: 3.8   Smokeless tobacco: Never  Vaping Use   Vaping Use: Former   Quit date: 02/28/2020  Substance and Sexual Activity   Alcohol use: Yes    Alcohol/week: 3.0 standard drinks    Types: 3 Standard drinks or equivalent per week   Drug use: Never   Sexual activity: Not Currently    Birth control/protection: None  Other Topics Concern   Not on file  Social History Narrative   Not on file   Social Determinants of Health   Financial Resource Strain: Not on file  Food Insecurity: Not on file  Transportation Needs: Not on file  Physical Activity: Not on file  Stress: Not on file  Social Connections: Not on file  Intimate Partner Violence: Not on file    Outpatient Medications Prior to Visit  Medication Sig Dispense Refill   Acetaminophen (TYLENOL ARTHRITIS PAIN PO) Take 1,300 mg by mouth 2 (two) times daily as needed.     pantoprazole (PROTONIX) 40 MG tablet Take 1 tablet (40 mg total) by mouth daily. (Patient not taking: Reported on  11/20/2020) 30 tablet 3   phentermine (ADIPEX-P) 37.5 MG tablet Take 1 tablet (37.5 mg total) by mouth daily before breakfast. 30 tablet 0   No facility-administered medications prior to visit.      ROS:  Review of Systems  Constitutional:  Negative for fever.  Gastrointestinal:  Negative for blood in stool, constipation, diarrhea, nausea and vomiting.  Genitourinary:  Negative for dyspareunia, dysuria, flank pain, frequency, hematuria, urgency, vaginal bleeding, vaginal discharge and vaginal pain.  Musculoskeletal:  Negative for back pain.  Skin:  Negative for rash.  BREAST: No symptoms   OBJECTIVE:   Vitals:  BP (!) 150/90   Ht 6\' 1"  (1.854 m)   Wt (!) 317 lb (143.8 kg)   LMP 11/17/2020 (Exact Date)   BMI 41.82 kg/m   Physical Exam Constitutional:      Appearance: Normal appearance.  Pulmonary:     Effort: Pulmonary effort is normal.   Musculoskeletal:        General: Normal range of motion.  Neurological:     Mental Status: She is alert and oriented to person, place, and time.  Psychiatric:        Judgment: Judgment normal.   PT DECLINES GYN EXAM DUE TO MENSES  Assessment/Plan: Vaginal itching--pt declines exam. Given sx and hx, most likely recurrent yeast/fungal. Will treat with diflucan wkly for 2 wks each cycle (pt to start before sx start; very predictable). Rx eRxd. If doesn't work, pt to RTO for vag exam. D/C pantyliners or change frequently to keep dry.   Candidal vaginitis - Plan: fluconazole (DIFLUCAN) 150 MG tablet  Screening for STD (sexually transmitted disease) - Plan: HSV 2 antibody, IgG; hx of HSV 2 exposure. Recheck HSV 2 IgG. Pt with same partner. No hx of outbreak.   Weight loss counseling, encounter for - Plan: phentermine (ADIPEX-P) 37.5 MG tablet; pt to resume keto/intermittent fasting that worked well before. Rx adipex for 2 months while pt starts. F/u prn.     Meds ordered this encounter  Medications   phentermine (ADIPEX-P) 37.5 MG tablet    Sig: Take 1 tablet (37.5 mg total) by mouth daily before breakfast.    Dispense:  30 tablet    Refill:  1    Order Specific Question:   Supervising Provider    Answer:   11/19/2020 Nadara Mustard   fluconazole (DIFLUCAN) 150 MG tablet    Sig: Take 1 tab wkly for 2 wks (luteal phase) each month as preventive    Dispense:  10 tablet    Refill:  0    Order Specific Question:   Supervising Provider    Answer:   [[id number]] Nadara Mustard      Return if symptoms worsen or fail to improve.  Maresa Morash B. Kamry Faraci, PA-C 11/20/2020 4:43 PM

## 2020-11-21 LAB — HSV 2 ANTIBODY, IGG: HSV 2 IgG, Type Spec: <0.91 index (ref 0.00–0.90)

## 2020-12-10 ENCOUNTER — Ambulatory Visit: Payer: BC Managed Care – PPO | Admitting: Family Medicine

## 2021-03-01 ENCOUNTER — Other Ambulatory Visit: Payer: Self-pay

## 2021-03-01 ENCOUNTER — Ambulatory Visit
Admission: EM | Admit: 2021-03-01 | Discharge: 2021-03-01 | Disposition: A | Discharge status: Home / Self Care | Payer: BC Managed Care – PPO | Attending: Emergency Medicine | Admitting: Emergency Medicine

## 2021-03-01 DIAGNOSIS — M546 Pain in thoracic spine: Secondary | ICD-10-CM

## 2021-03-01 MED ORDER — PREDNISONE 20 MG PO TABS: 40.0000 mg | ORAL_TABLET | Freq: Every day | ORAL | 0 refills | Status: DC

## 2021-03-01 MED ORDER — KETOROLAC TROMETHAMINE 60 MG/2ML IM SOLN
30.0000 mg | Freq: Once | INTRAMUSCULAR | Status: AC
  Administered 2021-03-01: 30 mg via INTRAMUSCULAR

## 2021-03-01 MED ORDER — CYCLOBENZAPRINE HCL 10 MG PO TABS: 10.0000 mg | ORAL_TABLET | Freq: Every day | ORAL | 0 refills | Status: DC

## 2021-03-01 NOTE — ED Provider Notes (Signed)
[facility]    CSN: [id number] Arrival date & time: 03/01/21  1417      History   Chief Complaint Chief Complaint  Patient presents with   Back Pain    HPI Jillian Garcia is a 31 y.o. female.   Patient presents with mid back pain for 4 days  beginning after reaching for object. Pain is worsened with movement but does not radiate. Can not be felt when sitting upright. Endorses over the last week she has begun exercising and has not been stretching prior to or after work outs. Has attempted use of tylenol and ibuprofen with no relief.  Denies numbness, tingling, urinary or bowel incontinence, prior injury or trauma.    Past Medical History:  Diagnosis Date   Anxiety    BV (bacterial vaginosis)    Chlamydia 10/2016   Depression    Femoral fracture (HCC)    left   HSV-2 seropositive    Vitamin D deficiency     Patient Active Problem List   Diagnosis Date Noted   Gastroesophageal reflux disease with esophagitis without hemorrhage 11/12/2020   Seasonal allergic rhinitis 11/12/2020   Chronic ankle pain, bilateral 11/12/2020   Chronic idiopathic constipation 11/12/2020   Morbid obesity ([place]) 11/12/2020    Past Surgical History:  Procedure Laterality Date   KNEE SURGERY Left 2014    OB History     Gravida  0   Para  0   Term  0   Preterm  0   AB  0   Living  0      SAB  0   IAB  0   Ectopic  0   Multiple  0   Live Births  0            Home Medications    Prior to Admission medications   Medication Sig Start Date End Date Taking? Authorizing Provider  Acetaminophen (TYLENOL ARTHRITIS PAIN PO) Take 1,300 mg by mouth 2 (two) times daily as needed.   Yes [provider]  fluconazole (DIFLUCAN) 150 MG tablet Take 1 tab wkly for 2 wks (luteal phase) each month as preventive [id number]  Yes Copland, Alicia B, PA-C  pantoprazole (PROTONIX) 40 MG tablet Take 1 tablet (40 mg total) by mouth daily. 11/12/20  Yes Montel Culver,  MD  phentermine (ADIPEX-P) 37.5 MG tablet Take 1 tablet (37.5 mg total) by mouth daily before breakfast. [id number]  Yes Copland, Deirdre Evener, PA-C    Family History Family History  Problem Relation Age of Onset   Hypertension Mother    Hypertension Father    Diabetes Maternal Aunt    Diabetes Maternal Uncle    Diabetes Maternal Grandmother    Breast cancer Maternal Grandmother 37   Breast cancer Paternal Grandmother 57    Social History Social History   Tobacco Use   Smoking status: Former    Packs/day: 0.50    Years: 10.00    Pack years: 5.00    Types: Cigarettes    Quit date: 2019    Years since quitting: 4.0   Smokeless tobacco: Never  Vaping Use   Vaping Use: Former   Quit date: 02/28/2020  Substance Use Topics   Alcohol use: Yes    Alcohol/week: 3.0 standard drinks    Types: 3 Standard drinks or equivalent per week   Drug use: Never     Allergies   Patient has no known allergies.   Review of Systems Review of Systems  Constitutional: Negative.   Respiratory: Negative.    Cardiovascular: Negative.   Musculoskeletal:  Positive for back pain. Negative for arthralgias, gait problem, joint swelling, myalgias, neck pain and neck stiffness.  Skin: Negative.   Neurological: Negative.     Physical Exam Triage Vital Signs ED Triage Vitals  Enc Vitals Group     BP 03/01/21 1459 126/83     Pulse Rate 03/01/21 1459 71     Resp 03/01/21 1459 18     Temp 03/01/21 1459 98.8 F (37.1 C)     Temp Source 03/01/21 1459 Oral     SpO2 03/01/21 1459 100 %     Weight 03/01/21 1457 300 lb (136.1 kg)     Height 03/01/21 1457 6\' 1"  (1.854 m)     Head Circumference --      Peak Flow --      Pain Score 03/01/21 1457 10     Pain Loc --      Pain Edu? --      Excl. in [place]? --    No data found.  Updated Vital Signs BP 126/83 (BP Location: Left Arm)    Pulse 71    Temp 98.8 F (37.1 C) (Oral)    Resp 18    Ht 6\' 1"  (1.854 m)    Wt 300 lb (136.1 kg)    LMP 02/20/2021    SpO2  100%    BMI 39.58 kg/m   Visual Acuity Right Eye Distance:   Left Eye Distance:   Bilateral Distance:    Right Eye Near:   Left Eye Near:    Bilateral Near:     Physical Exam Constitutional:      Appearance: Normal appearance.  HENT:     Head: Normocephalic.  Eyes:     Extraocular Movements: Extraocular movements intact.  Musculoskeletal:     Comments: Unable to reproduce tenderness on exam however patient now takes the mid thoracic region as area of discomfort, no swelling, deformity, spasms noted, range of motion intact but elicits pain  Skin:    General: Skin is warm and dry.  Neurological:     Mental Status: She is alert and oriented to person, place, and time. Mental status is at baseline.  Psychiatric:        Mood and Affect: Mood normal.        Behavior: Behavior normal.     UC Treatments / Results  Labs (all labs ordered are listed, but only abnormal results are displayed) Labs Reviewed - No data to display  EKG   Radiology No results found.  Procedures Procedures (including critical care time)  Medications Ordered in UC Medications - No data to display  Initial Impression / Assessment and Plan / UC Course  I have reviewed the triage vital signs and the nursing notes.  Pertinent labs & imaging results that were available during my care of the patient were reviewed by me and considered in my medical decision making (see chart for details).  Acute midline thoracic back pain  Etiology of symptoms is most likely muscular, no red flag symptoms noted today, Toradol injection given in office, will prescribe prednisone burst for 5 days as patient has been consistently using NSAIDs without relief, Flexeril prescribed for bedtime for additional comfort, recommended ice, heat in 15-minute intervals, daily stretching, pillows for support, recommended follow-up with orthopedic specialist if symptoms continue to persist, work note given Final Clinical Impressions(s)  / UC Diagnoses   Final diagnoses:  None  Discharge Instructions   None    ED Prescriptions   None    PDMP not reviewed this encounter.   Hans Eden, [place] 03/01/21 [phone]

## 2021-03-01 NOTE — Discharge Instructions (Signed)
Your pain is most likely caused by irritation to the muscles or ligaments.   Beginning tomorrow take prednisone every morning with food for the next 5 days  You may use a muscle relaxer at bedtime for additional comfort  While on prednisone please avoid use of ibuprofen, naproxen, Aleve, Motrin, may use Tylenol 500 to 1000 mg every 6 hours as needed for pain  You may use heating pad in 15 minute intervals as needed for additional comfort, or you may find comfort in using ice in 10-15 minutes over affected area   Begin stretching affected area daily for 10 minutes as tolerated to further loosen muscles   When lying down place pillow underneath and between knees for support  Can try sleeping without pillow on firm mattress   Practice good posture: head back, shoulders back, chest forward, pelvis back and weight distributed evenly on both legs  If pain persist after recommended treatment or reoccurs if may be beneficial to follow up with orthopedic specialist for evaluation, this doctor specializes in the bones and can manage your symptoms long-term with options such as but not limited to imaging, medications or physical therapy

## 2021-03-01 NOTE — ED Triage Notes (Signed)
Pt c/o back pain x3-4days. Pt states that she strained her back on Tuesday. Pt has been going to the gym but has not been stretching for the past week. Pt has not worked out prior to this. Pt states that she has reached up above her head and then felt a really sharp pain in her back. Pt states that it feels like her spine is rubbing against itself.

## 2021-04-15 ENCOUNTER — Ambulatory Visit (LOCAL_COMMUNITY_HEALTH_CENTER): Payer: Self-pay | Admitting: Family Medicine

## 2021-04-15 ENCOUNTER — Encounter: Payer: Self-pay | Admitting: Family Medicine

## 2021-04-15 ENCOUNTER — Other Ambulatory Visit: Payer: Self-pay

## 2021-04-15 VITALS — BP 120/70 | Wt 279.0 lb

## 2021-04-15 DIAGNOSIS — Z113 Encounter for screening for infections with a predominantly sexual mode of transmission: Secondary | ICD-10-CM

## 2021-04-15 DIAGNOSIS — Z0000 Encounter for general adult medical examination without abnormal findings: Secondary | ICD-10-CM

## 2021-04-15 DIAGNOSIS — Z309 Encounter for contraceptive management, unspecified: Secondary | ICD-10-CM

## 2021-04-15 LAB — WET PREP FOR TRICH, YEAST, CLUE
Trichomonas Exam: NEGATIVE
Yeast Exam: NEGATIVE

## 2021-04-15 LAB — HM HIV SCREENING LAB: HM HIV Screening: NEGATIVE

## 2021-04-15 NOTE — Progress Notes (Signed)
Pt here for PE and STD screening. Wet prep reviewed. No tx per standing orders. Dental provider list given and [facility] info given.  ?Bwiedenheft RN ?

## 2021-04-15 NOTE — Progress Notes (Signed)
[facility] DEPARTMENT ?[facility] ?[address] [employer] ?Main Number: [phone] ? ? ? ?Family Planning Visit- Initial Visit ? ?Subjective:  ?Jillian Garcia is a 31 y.o.  G0P0000   being seen today for an initial annual visit and to discuss reproductive life planning.  The patient is currently using Abstinence for pregnancy prevention. Patient reports   does want a pregnancy in the next year.   ? ? report they are not  looking for a method.  ? ?Patient has the following medical conditions has Gastroesophageal reflux disease with esophagitis without hemorrhage; Seasonal allergic rhinitis; Chronic ankle pain, bilateral; Chronic idiopathic constipation; and Morbid obesity (HCC) on their problem list. ? ?Chief Complaint  ?Patient presents with  ? Annual Exam  ? ? ?Patient reports here for physical and desires  STI screening  ? ?Patient denies any s/sx   ? ?Body mass index is 36.81 kg/m?. - Patient is eligible for diabetes screening based on BMI and age >85?  not applicable ?HA1C ordered? not applicable ? ?Patient reports 1  partner/s in last year. Desires STI screening?  Yes ? ?Has patient been screened once for HCV in the past?  No ? No results found for: HCVAB ? ?Does the patient have current drug use (including MJ), have a partner with drug use, and/or has been incarcerated since last result? No  ?If yes-- Screen for HCV through [facility] ?  ?Does the patient meet criteria for HBV testing? No ? ?Criteria:  ?-Household, sexual or needle sharing contact with HBV ?-History of drug use ?-HIV positive ?-Those with known Hep C ? ? ?Health Maintenance Due  ?Topic Date Due  ? TETANUS/TDAP  10/13/2017  ? COVID-19 Vaccine (2 - Moderna risk series) 05/21/2019  ? ? ?Review of Systems  ?Constitutional:  Negative for chills, fever, malaise/fatigue and weight loss.  ?HENT:  Negative for congestion, hearing loss and sore throat.   ?Eyes:  Negative for blurred vision, double vision and photophobia.   ?Respiratory:  Negative for shortness of breath.   ?Cardiovascular:  Negative for chest pain.  ?Gastrointestinal:  Negative for abdominal pain, blood in stool, constipation, diarrhea, heartburn, nausea and vomiting.  ?Genitourinary:  Negative for dysuria and frequency.  ?Musculoskeletal:  Negative for back pain, joint pain and neck pain.  ?Skin:  Negative for itching and rash.  ?Neurological:  Negative for dizziness, weakness and headaches.  ?Endo/Heme/Allergies:  Does not bruise/bleed easily.  ?Psychiatric/Behavioral:  Negative for depression, substance abuse and suicidal ideas.   ? ?The following portions of the patient's history were reviewed and updated as appropriate: allergies, current medications, past family history, past medical history, past social history, past surgical history and problem list. Problem list updated. ? ? ?See flowsheet for other program required questions. ? ?Objective:  ? ?Vitals:  ? 04/15/21 1430  ?BP: 120/70  ?Weight: 279 lb (126.6 kg)  ? ? ?Physical Exam ?Vitals and nursing note reviewed.  ?Constitutional:   ?   Appearance: Normal appearance.  ?HENT:  ?   Head: Normocephalic and atraumatic.  ?   Mouth/Throat:  ?   Mouth: Mucous membranes are moist.  ?   Dentition: Normal dentition. No dental caries.  ?   Pharynx: No oropharyngeal exudate or posterior oropharyngeal erythema.  ?Eyes:  ?   General: No scleral icterus. ?Neck:  ?   Thyroid: No thyroid mass, thyromegaly or thyroid tenderness.  ?   Comments:   ?Cardiovascular:  ?   Rate and Rhythm: Normal  rate and regular rhythm.  ?   Pulses: Normal pulses.  ?   Heart sounds: Normal heart sounds.  ?Pulmonary:  ?   Effort: Pulmonary effort is normal.  ?   Breath sounds: Normal breath sounds.  ?Chest:  ?   Comments: Breasts:  ?      Right: Normal. No swelling, mass, nipple discharge, skin change or tenderness.  ?      Left: Normal. No swelling, mass, nipple discharge, skin change or tenderness.  ?Abdominal:  ?   General: Abdomen is flat.  Bowel sounds are normal.  ?   Palpations: Abdomen is soft.  ?Genitourinary: ?   Comments: Deferred, pt self colleted specimen ?Musculoskeletal:     ?   General: Normal range of motion.  ?   Cervical back: Normal range of motion and neck supple.  ?Skin: ?   General: Skin is warm and dry.  ?Neurological:  ?   General: No focal deficit present.  ?   Mental Status: She is alert and oriented to person, place, and time.  ?Psychiatric:     ?   Mood and Affect: Mood normal.     ?   Behavior: Behavior normal.  ? ? ? ? ?Assessment and Plan:  ?Jillian Garcia is a 31 y.o. female presenting to the [facility] Department for an initial annual wellness/contraceptive visit ? ?Contraception counseling: Reviewed options based on patient desire and reproductive life plan. Patient is interested in Abstinence. This was provided to the patient today.  ? ?Risks, benefits, and typical effectiveness rates were reviewed.  Questions were answered.  Written information was also given to the patient to review.   ? ?The patient will follow up in  as needed  for surveillance.  The patient was told to call with any further questions, or with any concerns about this method of contraception.  Emphasized use of condoms 100% of the time for STI prevention. ? ?Patient was not  offered ECP based on last sex  Patient is within >1 month days of unprotected sex.   ? ?1. Routine general medical examination at a health care facility ?Well woman exam today  ?CBE today  ?PAP due 03/2025 ? ?2. Screening examination for venereal disease ?Patient accepted all screenings including wet prepp, vaginal CT/GC and bloodwork for HIV/RPR.  ?Patient meets criteria for HepB screening? No. Ordered? No -   ?Patient meets criteria for HepC screening? No. Ordered? No -   ? ?Wet prep results neg    ?NO Treatment needed ?Discussed time line for State Lab results and that patient will be called with positive results and encouraged patient to call if she had not heard in  2 weeks.  ?Counseled to return or seek care for continued or worsening symptoms ?Recommended condom use with all sex ? ?Patient is currently using  abstinence   to prevent pregnancy.   ? ?- Chlamydia/Gonorrhea [place] [facility] ?- WET PREP FOR TRICH, YEAST, CLUE ?- Syphilis Serology, [place] [facility] ?- HIV [place] [facility] ? ? ? ? ?No follow-ups on file. ? ?No future appointments. ? ?Wendi Snipes, FNP ? ?

## 2021-04-29 ENCOUNTER — Other Ambulatory Visit: Payer: Self-pay | Admitting: Family Medicine

## 2021-05-29 ENCOUNTER — Other Ambulatory Visit: Payer: Self-pay | Admitting: Obstetrics and Gynecology

## 2021-05-29 ENCOUNTER — Telehealth: Payer: Self-pay

## 2021-05-29 DIAGNOSIS — Z713 Dietary counseling and surveillance: Secondary | ICD-10-CM

## 2021-05-29 NOTE — Telephone Encounter (Signed)
Pt left message on triage wanting a refill on phentermine. Do you need to see her for a weight loss appointment? ?

## 2021-05-30 NOTE — Telephone Encounter (Signed)
Called pt, no answer, LVMTRC. 

## 2021-06-04 MED ORDER — PHENTERMINE HCL 37.5 MG PO CAPS: 37.5000 mg | ORAL_CAPSULE | ORAL | 1 refills | Status: DC

## 2021-06-04 NOTE — Telephone Encounter (Signed)
Spoke with pt. She lost 20# in 2 months on phentermine but has gained it and more back since stopping phentermine. Did intermittent fasting with phentermine but eating normally again. Plans to go back to gym and restart fasting. Discussed importance of sticking to wt loss program after stopping phentermine and indications for only short term use of it. Pt understands. ? ?Meds ordered this encounter  ?Medications  ? phentermine 37.5 MG capsule  ?  Sig: Take 1 capsule (37.5 mg total) by mouth every morning.  ?  Dispense:  30 capsule  ?  Refill:  1  ?  Order Specific Question:   Supervising Provider  ?  Answer:   CONSTANT, PEGGY [[id number]]  ? ? ?

## 2021-09-06 ENCOUNTER — Ambulatory Visit
Admission: EM | Admit: 2021-09-06 | Discharge: 2021-09-06 | Disposition: A | Discharge status: Home / Self Care | Payer: Self-pay | Attending: Emergency Medicine | Admitting: Emergency Medicine

## 2021-09-06 DIAGNOSIS — J029 Acute pharyngitis, unspecified: Secondary | ICD-10-CM

## 2021-09-06 DIAGNOSIS — J039 Acute tonsillitis, unspecified: Secondary | ICD-10-CM

## 2021-09-06 LAB — POCT RAPID STREP A (OFFICE): Rapid Strep A Screen: NEGATIVE

## 2021-09-06 MED ORDER — AMOXICILLIN 875 MG PO TABS: 875.0000 mg | ORAL_TABLET | Freq: Two times a day (BID) | ORAL | 0 refills | Status: AC

## 2021-09-06 NOTE — ED Provider Notes (Signed)
Renaldo Fiddler    CSN: [id number] Arrival date & time: 09/06/21  1926      History   Chief Complaint Chief Complaint  Patient presents with   Sore Throat    HPI Jillian Garcia is a 31 y.o. female.  Patient presents with sore throat and white spots x1 day.  She denies fever, chills, rash, cough, shortness of breath, or other symptoms.  No treatments at home.  She reports history of tonsil stones.  The history is provided by the patient and medical records.    Past Medical History:  Diagnosis Date   Anxiety    BV (bacterial vaginosis)    Chlamydia 10/2016   Depression    Femoral fracture (HCC)    left   HSV-2 seropositive    Vitamin D deficiency     Patient Active Problem List   Diagnosis Date Noted   Gastroesophageal reflux disease with esophagitis without hemorrhage 11/12/2020   Seasonal allergic rhinitis 11/12/2020   Chronic ankle pain, bilateral 11/12/2020   Chronic idiopathic constipation 11/12/2020   Morbid obesity (HCC) 11/12/2020    Past Surgical History:  Procedure Laterality Date   KNEE SURGERY Left 2014    OB History     Gravida  0   Para  0   Term  0   Preterm  0   AB  0   Living  0      SAB  0   IAB  0   Ectopic  0   Multiple  0   Live Births  0            Home Medications    Prior to Admission medications   Medication Sig Start Date End Date Taking? Authorizing Provider  amoxicillin (AMOXIL) 875 MG tablet Take 1 tablet (875 mg total) by mouth 2 (two) times daily for 10 days. 09/06/21 09/16/21 Yes Mickie Bail, NP  Acetaminophen (TYLENOL ARTHRITIS PAIN PO) Take 1,300 mg by mouth 2 (two) times daily as needed. Patient not taking: Reported on 04/15/2021    [provider]  cyclobenzaprine (FLEXERIL) 10 MG tablet Take 1 tablet (10 mg total) by mouth at bedtime. Patient not taking: Reported on 04/15/2021 03/01/21   Valinda Hoar, NP  phentermine 37.5 MG capsule Take 1 capsule (37.5 mg total) by mouth every  morning. 06/04/21   Copland, Ilona Sorrel, PA-C  predniSONE (DELTASONE) 20 MG tablet Take 2 tablets (40 mg total) by mouth daily. Patient not taking: Reported on 04/15/2021 03/01/21   Valinda Hoar, NP    Family History Family History  Problem Relation Age of Onset   Hypertension Mother    Hypertension Father    Diabetes Maternal Aunt    Diabetes Maternal Uncle    Diabetes Maternal Grandmother    Breast cancer Maternal Grandmother 66   Breast cancer Paternal Grandmother 67    Social History Social History   Tobacco Use   Smoking status: Former    Packs/day: 0.50    Years: 10.00    Total pack years: 5.00    Types: Cigarettes    Quit date: 2019    Years since quitting: 4.6   Smokeless tobacco: Never  Vaping Use   Vaping Use: Former   Quit date: 02/28/2020  Substance Use Topics   Alcohol use: Yes    Alcohol/week: 2.0 standard drinks of alcohol    Types: 2 Standard drinks or equivalent per week   Drug use: Never  Allergies   Patient has no known allergies.   Review of Systems Review of Systems  Constitutional:  Negative for chills and fever.  HENT:  Positive for sore throat. Negative for ear pain, trouble swallowing and voice change.   Respiratory:  Negative for cough and shortness of breath.   Gastrointestinal:  Negative for diarrhea and vomiting.  Skin:  Negative for color change and rash.  All other systems reviewed and are negative.    Physical Exam Triage Vital Signs ED Triage Vitals [09/06/21 1935]  Enc Vitals Group     BP 120/78     Pulse Rate 85     Resp 18     Temp 98.1 F (36.7 C)     Temp Source Temporal     SpO2 97 %     Weight      Height      Head Circumference      Peak Flow      Pain Score      Pain Loc      Pain Edu?      Excl. in GC?    No data found.  Updated Vital Signs BP 120/78 (BP Location: Left Arm)   Pulse 85   Temp 98.1 F (36.7 C) (Temporal)   Resp 18   LMP 06/25/2021   SpO2 97%   Visual Acuity Right Eye  Distance:   Left Eye Distance:   Bilateral Distance:    Right Eye Near:   Left Eye Near:    Bilateral Near:     Physical Exam Vitals and nursing note reviewed.  Constitutional:      General: She is not in acute distress.    Appearance: She is well-developed. She is not ill-appearing.  HENT:     Right Ear: Tympanic membrane normal.     Left Ear: Tympanic membrane normal.     Nose: Nose normal.     Mouth/Throat:     Mouth: Mucous membranes are moist.     Pharynx: Uvula midline. Posterior oropharyngeal erythema present.     Tonsils: 1+ on the right. 3+ on the left.  Cardiovascular:     Rate and Rhythm: Normal rate and regular rhythm.     Heart sounds: Normal heart sounds.  Pulmonary:     Effort: Pulmonary effort is normal. No respiratory distress.     Breath sounds: Normal breath sounds.  Musculoskeletal:     Cervical back: Neck supple.  Skin:    General: Skin is warm and dry.  Neurological:     Mental Status: She is alert.  Psychiatric:        Mood and Affect: Mood normal.        Behavior: Behavior normal.      UC Treatments / Results  Labs (all labs ordered are listed, but only abnormal results are displayed) Labs Reviewed  POCT RAPID STREP A (OFFICE)    EKG   Radiology No results found.  Procedures Procedures (including critical care time)  Medications Ordered in UC Medications - No data to display  Initial Impression / Assessment and Plan / UC Course  I have reviewed the triage vital signs and the nursing notes.  Pertinent labs & imaging results that were available during my care of the patient were reviewed by me and considered in my medical decision making (see chart for details).    Sore throat, acute tonsillitis.  Rapid strep negative.  Left tonsil enlarged.  Discussed tonsil stone versus tonsil abscess.  Patient  declines transfer to the ED for evaluation.  Treating with amoxicillin.  Strict ED precautions discussed.  Tylenol or ibuprofen as  needed.  Education provided on sore throat and tonsillitis.  Instructed patient to follow-up with her PCP.  Patient agrees to plan of care.  Final Clinical Impressions(s) / UC Diagnoses   Final diagnoses:  Sore throat  Acute tonsillitis, unspecified etiology     Discharge Instructions      The strep test is negative.    Take the amoxicillin as directed.  Take Tylenol or ibuprofen as needed for discomfort.  Go to the emergency department if you have persistent or worsening symptoms.       ED Prescriptions     Medication Sig Dispense Auth. Provider   amoxicillin (AMOXIL) 875 MG tablet Take 1 tablet (875 mg total) by mouth 2 (two) times daily for 10 days. 20 tablet Mickie Bail, NP      PDMP not reviewed this encounter.   Mickie Bail, NP 09/06/21 2017

## 2021-09-06 NOTE — ED Triage Notes (Signed)
Patient presents to Urgent Care with complaints of sore throat and white spots x 1 day. Not taking any meds for sore throat.   Denies fever.

## 2021-09-06 NOTE — Discharge Instructions (Addendum)
The strep test is negative.    Take the amoxicillin as directed.  Take Tylenol or ibuprofen as needed for discomfort.  Go to the emergency department if you have persistent or worsening symptoms.

## 2021-09-08 ENCOUNTER — Emergency Department
Admission: EM | Admit: 2021-09-08 | Discharge: 2021-09-08 | Disposition: A | Discharge status: Home / Self Care | Payer: Self-pay | Attending: Emergency Medicine | Admitting: Emergency Medicine

## 2021-09-08 ENCOUNTER — Other Ambulatory Visit: Payer: Self-pay

## 2021-09-08 DIAGNOSIS — J020 Streptococcal pharyngitis: Secondary | ICD-10-CM | POA: Insufficient documentation

## 2021-09-08 LAB — GROUP A STREP BY PCR: Group A Strep by PCR: DETECTED — AB

## 2021-09-08 MED ORDER — DEXAMETHASONE SODIUM PHOSPHATE 10 MG/ML IJ SOLN
10.0000 mg | Freq: Once | INTRAMUSCULAR | Status: AC
  Administered 2021-09-08: 10 mg via INTRAMUSCULAR
  Filled 2021-09-08: qty 1

## 2021-09-08 NOTE — ED Provider Notes (Signed)
[facility] Emergency Department Provider Note     Event Date/Time   First MD Initiated Contact with Patient 09/08/21 2218     (approximate)   History   Sore Throat   HPI  Jillian Garcia is a 31 y.o. female presents is up to the ED, for evaluation of development of white exudates to her tonsils.  Patient was evaluated 3 days ago at a local urgent care, with the onset of sore throat.  She reports a negative rapid strep test, but notes that provider treated her empirically with Augmentin.  She is been on that medication for the last 3 days.  She denies any interim fevers, chills, or sweats.  She also denies any difficulty breathing, swallowing, or controlling oral secretions.  Presented for concerns over the development of the exudates which were absent at her initial presentation.   Physical Exam   Triage Vital Signs: ED Triage Vitals  Enc Vitals Group     BP 09/08/21 2003 120/73     Pulse Rate 09/08/21 2003 79     Resp 09/08/21 2003 18     Temp 09/08/21 2003 99 F (37.2 C)     Temp Source 09/08/21 2003 Oral     SpO2 09/08/21 2003 100 %     Weight 09/08/21 2004 289 lb (131.1 kg)     Height 09/08/21 2004 6\' 1"  (1.854 m)     Head Circumference --      Peak Flow --      Pain Score 09/08/21 2004 1     Pain Loc --      Pain Edu? --      Excl. in GC? --     Most recent vital signs: Vitals:   09/08/21 2003  BP: 120/73  Pulse: 79  Resp: 18  Temp: 99 F (37.2 C)  SpO2: 100%    General Awake, no distress.  HEENT NCAT. PERRL. EOMI. No rhinorrhea. Mucous membranes are moist.  Uvula is midline and tonsils are erythematous, enlarged.  Exudates noted bilaterally.  No airway compromise is appreciated. CV:  Good peripheral perfusion.  RESP:  Normal effort.  ABD:  No distention.    ED Results / Procedures / Treatments   Labs (all labs ordered are listed, but only abnormal results are displayed) Labs Reviewed  GROUP A STREP BY PCR - Abnormal;  Notable for the following components:      Result Value   Group A Strep by PCR DETECTED (*)    All other components within normal limits     EKG   RADIOLOGY   No results found.   PROCEDURES:  Critical Care performed: No  Procedures   MEDICATIONS ORDERED IN ED: Medications  dexamethasone (DECADRON) injection 10 mg (has no administration in time range)     IMPRESSION / MDM / ASSESSMENT AND PLAN / ED COURSE  I reviewed the triage vital signs and the nursing notes.                              Differential diagnosis includes, but is not limited to, tonsillitis, tonsilloliths, peritonsillar abscess, dental abscess, Ludewig's angina  Patient's presentation is most consistent with acute, uncomplicated illness.  Patient's diagnosis is consistent with strep pharyngitis. Patient will be discharged home with directions to continue the previously prescribed Augmentin. Patient is to follow up with primary provider or local urgent care as needed or otherwise directed. Patient  is given ED precautions to return to the ED for any worsening or new symptoms.     FINAL CLINICAL IMPRESSION(S) / ED DIAGNOSES   Final diagnoses:  Strep pharyngitis     Rx / DC Orders   ED Discharge Orders     None        Note:  This document was prepared using Dragon voice recognition software and may include unintentional dictation errors.    Lissa Hoard, PA-C 09/08/21 2248    Merwyn Katos, MD 09/08/21 [phone]

## 2021-09-08 NOTE — ED Triage Notes (Signed)
Pt states sore throat for several days. Pt stats was seen at urgent care on Friday and prescribed amoxicillin. Pt has swelling and redness with white patches noted to bilateral tonsils.

## 2021-09-08 NOTE — Discharge Instructions (Addendum)
Continue with the amoxicillin as prescribed.  Take over-the-counter Tylenol or Motrin as needed for pain or fever.  You should change her toothbrush after 24 hours on the antibiotic.

## 2022-01-07 ENCOUNTER — Other Ambulatory Visit: Payer: Self-pay | Admitting: Obstetrics and Gynecology

## 2022-01-07 DIAGNOSIS — Z713 Dietary counseling and surveillance: Secondary | ICD-10-CM

## 2022-01-31 ENCOUNTER — Telehealth: Payer: Self-pay | Admitting: Family Medicine

## 2022-01-31 NOTE — Telephone Encounter (Signed)
Copied from [place] [id number]. Topic: General - Other >> Jan 31, 2022 12:05 PM Oley Balm A wrote: Reason for CRM: Pt is requesting to switch from Dr.Matthews to Otilio Miu and is requesting for Otilio Miu to be her PCP.

## 2022-01-31 NOTE — Telephone Encounter (Signed)
Noted  KP 

## 2022-02-03 ENCOUNTER — Encounter: Payer: Self-pay | Admitting: Family Medicine

## 2022-02-12 ENCOUNTER — Encounter: Payer: BLUE CROSS/BLUE SHIELD | Admitting: Family Medicine

## 2022-02-25 ENCOUNTER — Ambulatory Visit: Payer: BLUE CROSS/BLUE SHIELD

## 2022-04-04 ENCOUNTER — Ambulatory Visit: Payer: BLUE CROSS/BLUE SHIELD

## 2022-04-22 ENCOUNTER — Ambulatory Visit: Payer: BLUE CROSS/BLUE SHIELD

## 2022-05-16 ENCOUNTER — Ambulatory Visit: Payer: BLUE CROSS/BLUE SHIELD

## 2022-05-20 ENCOUNTER — Ambulatory Visit: Payer: BLUE CROSS/BLUE SHIELD | Admitting: Family Medicine

## 2022-05-20 ENCOUNTER — Encounter: Payer: Self-pay | Admitting: Family Medicine

## 2022-05-20 VITALS — BP 122/74 | HR 83 | Temp 97.8°F | Ht 73.0 in | Wt 289.2 lb

## 2022-05-20 DIAGNOSIS — Z113 Encounter for screening for infections with a predominantly sexual mode of transmission: Secondary | ICD-10-CM

## 2022-05-20 DIAGNOSIS — Z01419 Encounter for gynecological examination (general) (routine) without abnormal findings: Secondary | ICD-10-CM

## 2022-05-20 LAB — WET PREP FOR TRICH, YEAST, CLUE
Trichomonas Exam: NEGATIVE
Yeast Exam: NEGATIVE

## 2022-05-20 NOTE — Progress Notes (Signed)
[facility] DEPARTMENT [facility] [address] Main Number: [phone]  Family Planning Visit- Repeat Yearly Visit  Subjective:  Jillian Garcia is a 32 y.o. G0P0000  being seen today for an annual wellness visit and to discuss contraception options.   The patient is currently using Pregnant/Seeking Pregnancy for pregnancy prevention. Patient does want a pregnancy in the next year.    report they are looking for a method that provides Other they do not want a method  Patient has the following medical problems: has Gastroesophageal reflux disease with esophagitis without hemorrhage; Seasonal allergic rhinitis; Chronic ankle pain, bilateral; Chronic idiopathic constipation; and Morbid obesity on their problem list.  Chief Complaint  Patient presents with   Annual Exam    Patient reports to clinic for annual exam and STI testing.   Patient denies concerns about self other than vaginal discharge.    See flowsheet for other program required questions.   Body mass index is 38.16 kg/m. - Patient is eligible for diabetes screening based on BMI> 25 and age >35?  no HA1C ordered? not applicable  Patient reports 5 of partners in last year. Desires STI screening?  Yes   Has patient been screened once for HCV in the past?  Yes  03/17/2017  Does the patient have current of drug use, have a partner with drug use, and/or has been incarcerated since last result? No  If yes-- Screen for HCV through [facility]   Does the patient meet criteria for HBV testing? No  Criteria:  -Household, sexual or needle sharing contact with HBV -History of drug use -HIV positive -Those with known Hep C   Health Maintenance Due  Topic Date Due   HPV VACCINES (3 - 3-dose series) 04/12/2008   DTaP/Tdap/Td (2 - Td or Tdap) 10/13/2017   COVID-19 Vaccine (2 - Moderna risk series) 05/21/2019    Review of Systems  Constitutional:  Negative for weight loss.  Eyes:   Negative for blurred vision.  Respiratory:  Negative for cough and shortness of breath.   Cardiovascular:  Negative for claudication.  Gastrointestinal:  Negative for nausea.  Genitourinary:  Negative for dysuria and frequency.  Skin:  Negative for rash.  Neurological:  Negative for headaches.  Endo/Heme/Allergies:  Does not bruise/bleed easily.    The following portions of the patient's history were reviewed and updated as appropriate: allergies, current medications, past family history, past medical history, past social history, past surgical history and problem list. Problem list updated.  Objective:   Vitals:   05/20/22 1003  BP: 122/74  Pulse: 83  Temp: 97.8 F (36.6 C)  Weight: 289 lb 3.2 oz (131.2 kg)  Height:  (1.854 m)    Physical Exam Vitals and nursing note reviewed.  Constitutional:      Appearance: Normal appearance.  HENT:     Head: Normocephalic and atraumatic.     Mouth/Throat:     Mouth: Mucous membranes are moist.     Pharynx: Oropharynx is clear. No oropharyngeal exudate or posterior oropharyngeal erythema.  Neck:     Thyroid: Thyromegaly present.  Pulmonary:     Effort: Pulmonary effort is normal.  Abdominal:     General: Abdomen is flat.     Palpations: There is no mass.     Tenderness: There is no abdominal tenderness. There is no rebound.  Genitourinary:    General: Normal vulva.     Exam position: Lithotomy position.  Pubic Area: No rash or pubic lice.      Labia:        Right: No rash or lesion.        Left: No rash or lesion.      Vagina: Bleeding present. No vaginal discharge, erythema or lesions.     Cervix: No cervical motion tenderness, discharge, friability, lesion or erythema.     Uterus: Normal.      Adnexa: Right adnexa normal and left adnexa normal.     Rectum: Normal.     Comments: Menses in vaginal canal Lymphadenopathy:     Head:     Right side of head: No preauricular or posterior auricular adenopathy.     Left  side of head: No preauricular or posterior auricular adenopathy.     Cervical: No cervical adenopathy.     Upper Body:     Right upper body: No supraclavicular, axillary or epitrochlear adenopathy.     Left upper body: No supraclavicular, axillary or epitrochlear adenopathy.     Lower Body: No right inguinal adenopathy. No left inguinal adenopathy.  Skin:    General: Skin is warm and dry.     Findings: No rash.  Neurological:     Mental Status: She is alert and oriented to person, place, and time.       Assessment and Plan:  Jillian Garcia is a 32 y.o. female G0P0000 presenting to the [facility] Department for an yearly wellness and contraception visit  Contraception counseling: Reviewed options based on patient desire and reproductive life plan. Patient is interested in Pregnant/Seeking Pregnancy. This was provided to the patient today.   Risks, benefits, and typical effectiveness rates were reviewed.  Questions were answered.  Written information was also given to the patient to review.    The patient will follow up in  1 years for surveillance.  The patient was told to call with any further questions, or with any concerns about this method of contraception.  Emphasized use of condoms 100% of the time for STI prevention.  Patient was assessed for need for ECP. Not indicated  1. Well woman exam with routine gynecological exam -CBE not due until 2026, last done in 2023 -Pap test not due until 2027, last done in 2022, NILM and HPV negative -[facility] and at that time their HPV was positive and so they wanted a repeat pap today. I reviewed the ASCCP guidelines, and stated that without records I would not repeat a pap today. ROI signed   -reports she wants to be pregnant and has been sexually active, never on birth control but has not become pregnant, reviewed having sex during "ovulation period"- pt is using an app -thyroid  gland enlarged- states this has been this way for some time and that she also has facial hair, and feels cold a lot -counseled that this could be a thyroid problem, PCOS or other etiology, and this can also affect her ability to get pregnant -encouraged patient to pick a PCP from provided list and get an appointment for lab work to evaluate these things -pt agrees to this  2. Screening for venereal disease -declined blood work today  - Chlamydia/Gonorrhea [facility] - WET PREP FOR TRICH, YEAST, CLUE  Return in about 1 year (around 05/20/2023) for annual well-woman exam.  No future appointments.  Lenice Llamas, [place]

## 2022-05-20 NOTE — Progress Notes (Signed)
Wet prep reviewed - negative results. Mort Smelser, RN  

## 2022-05-28 NOTE — Telephone Encounter (Signed)
Error

## 2022-11-28 ENCOUNTER — Telehealth: Payer: Self-pay

## 2022-11-28 NOTE — Telephone Encounter (Signed)
Patient called and stated that she has started her menstrual cycle today (11/28/2022), scheduled for colposcopy on 12/02/2022 @ [facility]. Patient stated that her cycle only lasts 5 days, should be very light on day of colposcopy, [facility], may be on antibiotics,  needs to know if she should keep appointment as scheduled. Per Ilda Basset, FNP-BC, Patient informed to keep appointment as planned, will try to do colposcopy, antibiotics will not affect results, tampon usage is okay, but if she is having BV symptoms, may not want to use tampons. Patient informed to avoid scented tampons/pads, verbalized understanding.

## 2022-12-02 ENCOUNTER — Ambulatory Visit: Payer: Self-pay | Attending: Hematology and Oncology | Admitting: Hematology and Oncology

## 2022-12-02 ENCOUNTER — Other Ambulatory Visit (HOSPITAL_COMMUNITY)
Admission: RE | Admit: 2022-12-02 | Discharge: 2022-12-02 | Disposition: A | Discharge status: Home / Self Care | Payer: Self-pay | Source: Ambulatory Visit | Attending: Hematology and Oncology | Admitting: Hematology and Oncology

## 2022-12-02 ENCOUNTER — Other Ambulatory Visit: Payer: Self-pay

## 2022-12-02 VITALS — BP 111/77 | Wt 300.0 lb

## 2022-12-02 DIAGNOSIS — R87620 Atypical squamous cells of undetermined significance on cytologic smear of vagina (ASC-US): Secondary | ICD-10-CM

## 2022-12-02 DIAGNOSIS — B977 Papillomavirus as the cause of diseases classified elsewhere: Secondary | ICD-10-CM

## 2022-12-02 DIAGNOSIS — N72 Inflammatory disease of cervix uteri: Secondary | ICD-10-CM | POA: Insufficient documentation

## 2022-12-02 NOTE — Progress Notes (Signed)
[name] is a 32 y.o. female who presents to [facility] clinic today with no complaints.    Pap Smear: Pap not smear completed today. Last Pap smear was 09/12/2022 and was abnormal - ASCUS/ HPV+ . Per patient has no history of an abnormal Pap smear. Last Pap smear result is available in Epic.   Physical exam: Breasts Breasts symmetrical. No skin abnormalities bilateral breasts. No nipple retraction bilateral breasts. No nipple discharge bilateral breasts. No lymphadenopathy. No lumps palpated bilateral breasts.       Pelvic/Bimanual Pap is not indicated today   GYNECOLOGY CLINIC COLPOSCOPY PROCEDURE NOTE  [name] is a 32 y.o. G0P0000 here for colposcopy for ASCUS with POSITIVE high risk HPV pap smear on 09/12/2022. Discussed role for HPV in cervical dysplasia, need for surveillance.  Patient given informed consent, signed copy in the chart, time out was performed.  Placed in lithotomy position. Cervix viewed with speculum and colposcope after application of acetic acid.   Colposcopy adequate? Yes  no visible lesions.  ECC specimen obtained. All specimens were labelled and sent to pathology.  Patient was given post procedure instructions.  Will follow up pathology and manage accordingly.  Routine preventative health maintenance measures emphasized.  Smoking History: Patient has never smoked and was not referred to quit line.    Patient Navigation: Patient education provided. Access to services provided for patient through [facility] program. No interpreter provided. No transportation provided   Colorectal Cancer Screening: Per patient has never had colonoscopy completed No complaints today.    Breast and Cervical Cancer Risk Assessment: Patient has family history of breast cancer, with her maternal and paternal grandmothers. 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  Risk Assessment   No risk assessment data     A: BCCCP  exam without pap smear No complaints with benign exam. Colposcopy as above.   P: Will follow up accordingly with pending pathology. Will repeat Pap smear in one year if benign or low grade lesion. Will refer to gynecology if high grade lesion.   Ilda Basset A, NP 12/02/2022 1:16 PM

## 2022-12-04 LAB — SURGICAL PATHOLOGY

## 2023-05-12 IMAGING — CR DG ANKLE COMPLETE 3+V*L*
3 series · 3 of 3 positions shown · non-contrast
Comparison: None.

CLINICAL DATA: weightbearing, h/o pes planus, lateral ankle pain

EXAM:
LEFT ANKLE COMPLETE - 3+ VIEW

[ankle ap]
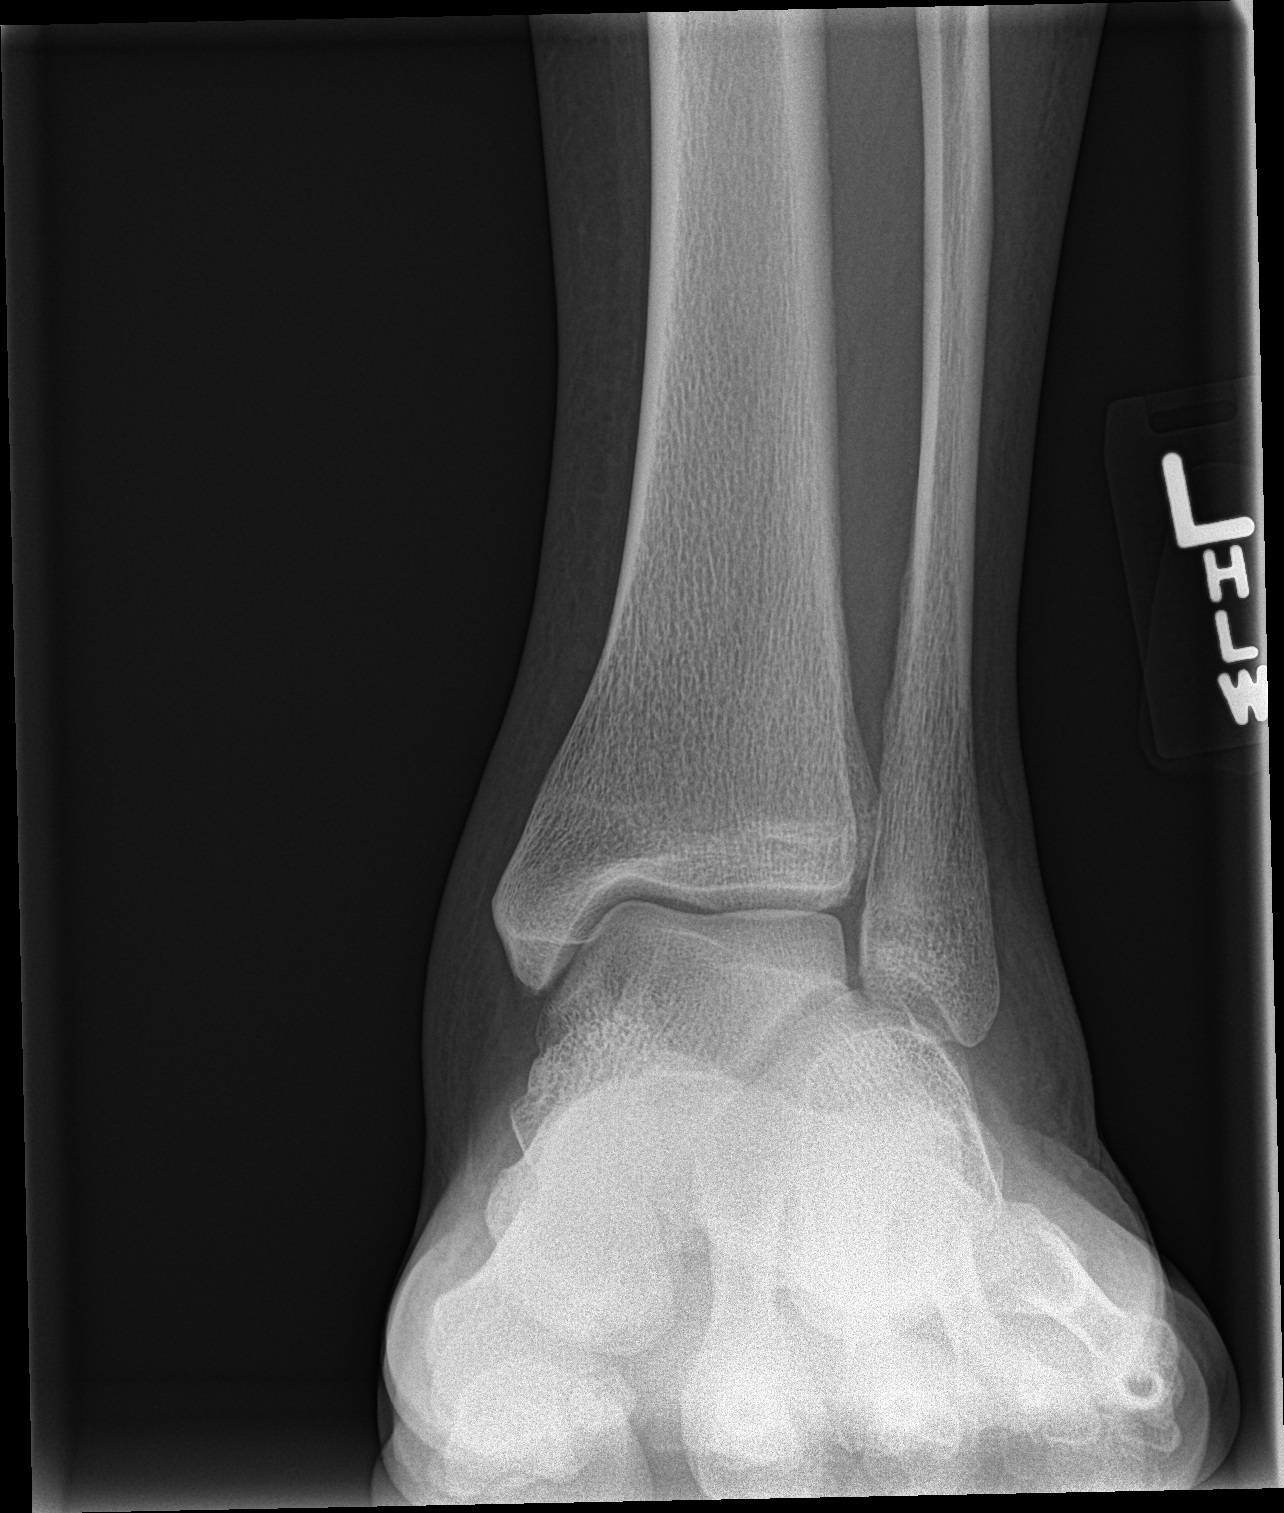

[ankle obl]
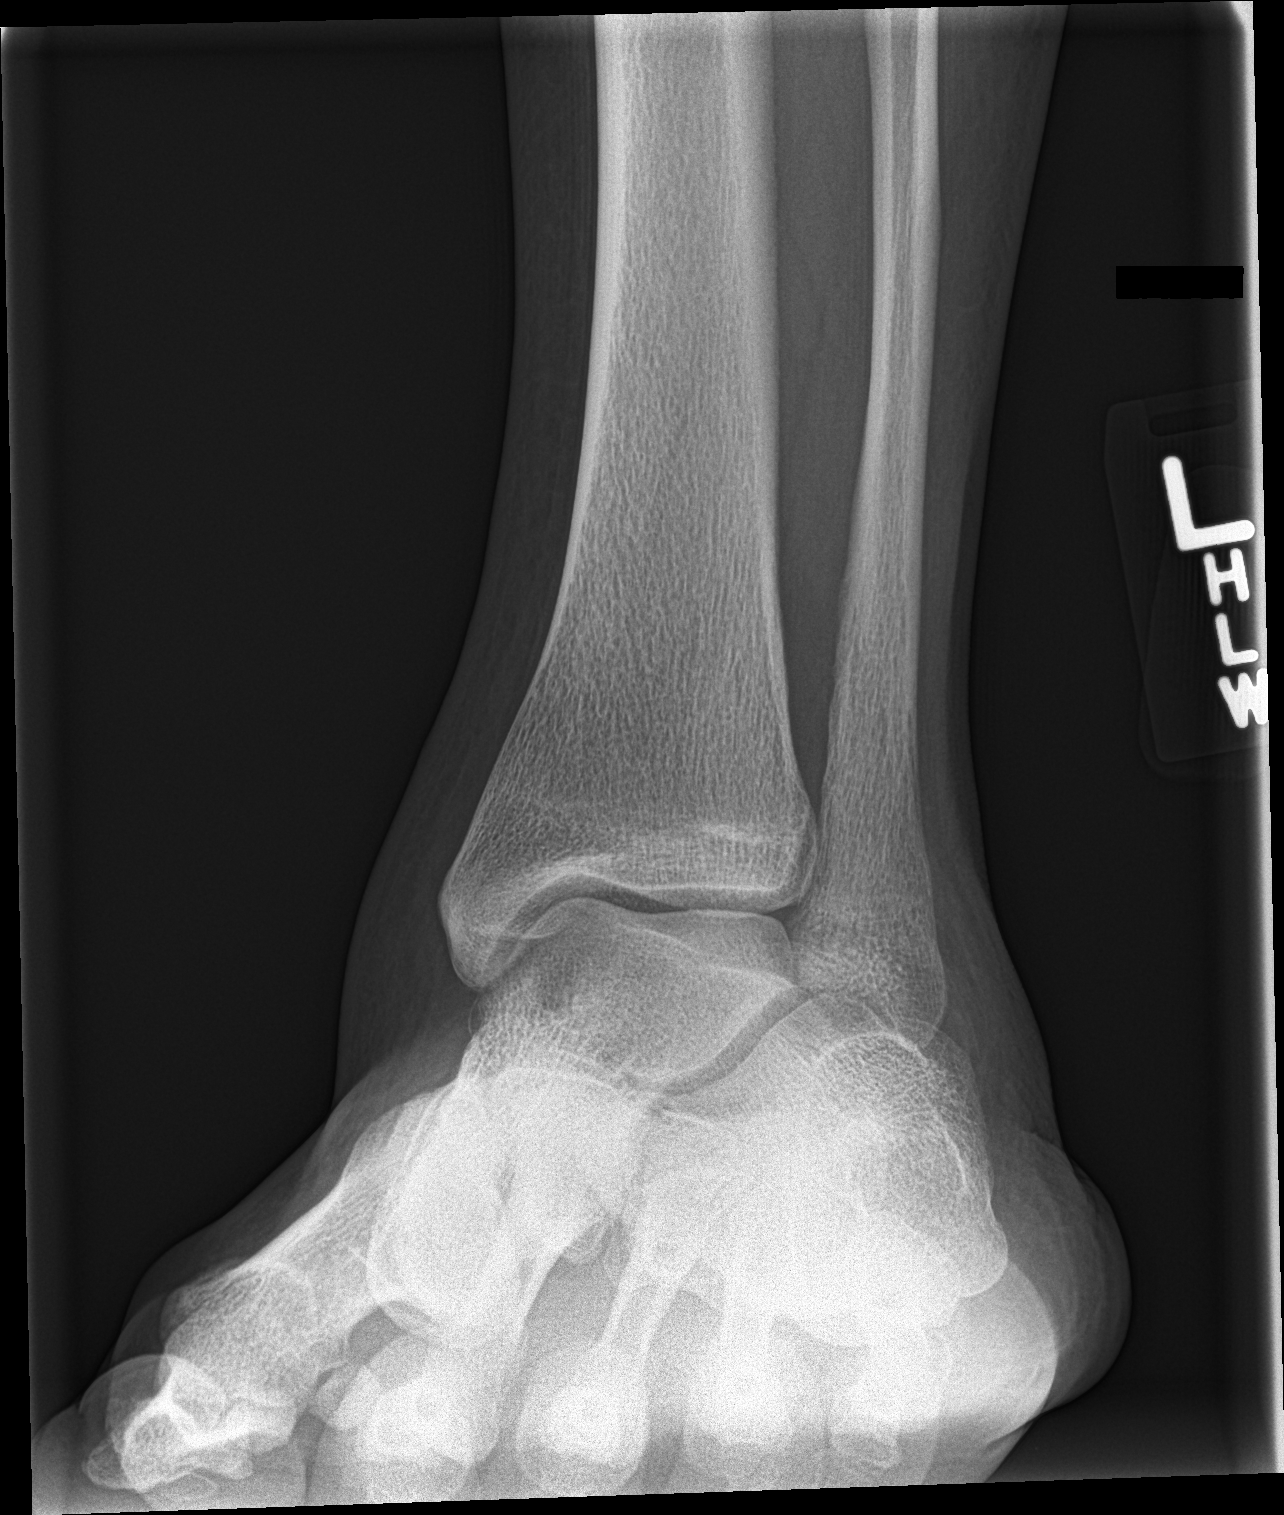

[ankle lat]
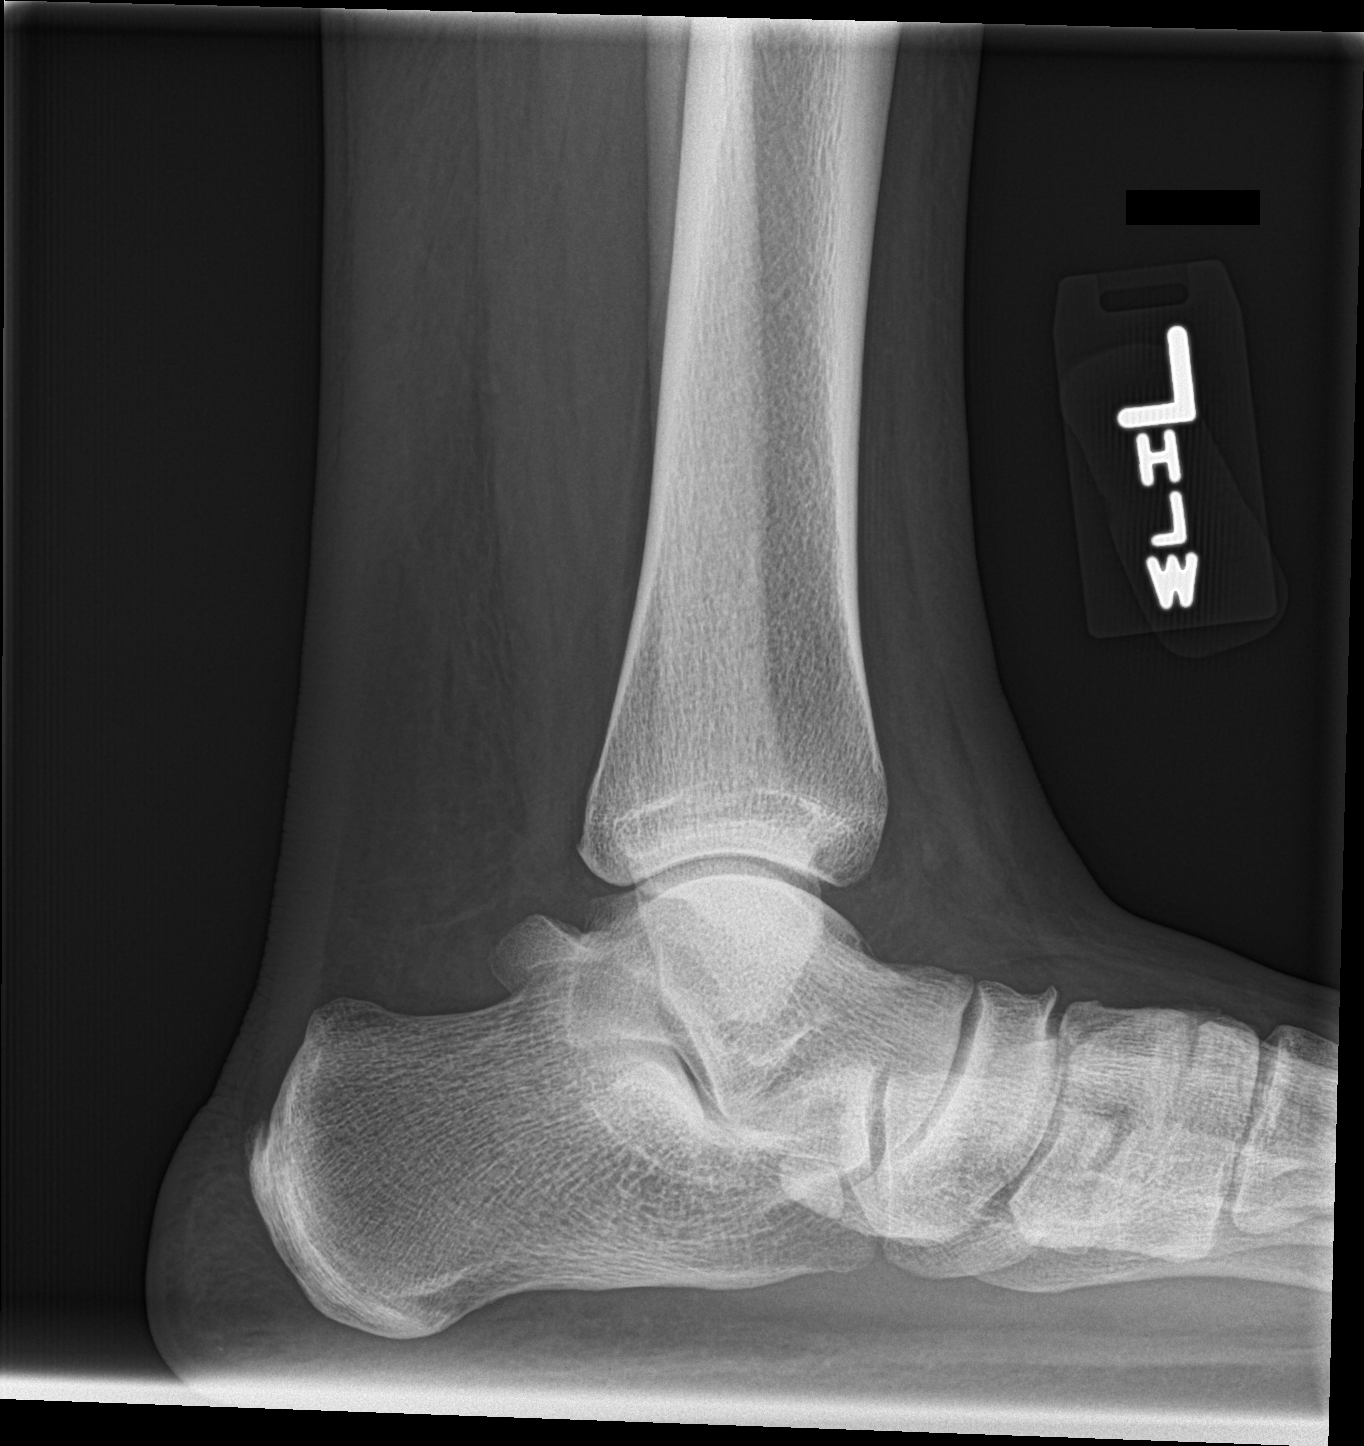

[3 of 3 positions shown; findings below may reference images not displayed]

FINDINGS: No acute fracture or dislocation. Enthesophyte of the Achilles
tendon. Pes planus. Midfoot degenerative changes. No area of erosion
or osseous destruction. No unexpected radiopaque foreign body. Soft
tissues are unremarkable.
IMPRESSION: No acute osseous abnormality.
